# Patient Record
Sex: Female | Born: 1935 | Race: White | Hispanic: No | Smoking: Former smoker
Health system: Southern US, Community
[De-identification: ages and names within clinical notes are randomized; demographics above are authoritative.]

## PROBLEM LIST (undated history)

## (undated) DIAGNOSIS — I499 Cardiac arrhythmia, unspecified: Secondary | ICD-10-CM

## (undated) DIAGNOSIS — G8929 Other chronic pain: Secondary | ICD-10-CM

## (undated) DIAGNOSIS — I2510 Atherosclerotic heart disease of native coronary artery without angina pectoris: Secondary | ICD-10-CM

## (undated) DIAGNOSIS — M549 Dorsalgia, unspecified: Secondary | ICD-10-CM

## (undated) DIAGNOSIS — K219 Gastro-esophageal reflux disease without esophagitis: Secondary | ICD-10-CM

## (undated) DIAGNOSIS — F329 Major depressive disorder, single episode, unspecified: Secondary | ICD-10-CM

## (undated) DIAGNOSIS — R51 Headache: Secondary | ICD-10-CM

## (undated) DIAGNOSIS — F32A Depression, unspecified: Secondary | ICD-10-CM

## (undated) HISTORY — PX: ABDOMINAL HYSTERECTOMY: SHX81

---

## 1998-12-23 ENCOUNTER — Ambulatory Visit (HOSPITAL_COMMUNITY): Admission: RE | Admit: 1998-12-23 | Discharge: 1998-12-23 | Payer: Self-pay | Admitting: Gastroenterology

## 1999-03-03 ENCOUNTER — Encounter (INDEPENDENT_AMBULATORY_CARE_PROVIDER_SITE_OTHER): Payer: Self-pay | Admitting: Specialist

## 1999-03-03 ENCOUNTER — Other Ambulatory Visit: Admission: RE | Admit: 1999-03-03 | Discharge: 1999-03-03 | Payer: Self-pay | Admitting: Gastroenterology

## 1999-03-17 ENCOUNTER — Ambulatory Visit (HOSPITAL_COMMUNITY): Admission: RE | Admit: 1999-03-17 | Discharge: 1999-03-17 | Payer: Self-pay | Admitting: Gastroenterology

## 1999-03-27 ENCOUNTER — Ambulatory Visit (HOSPITAL_COMMUNITY): Admission: RE | Admit: 1999-03-27 | Discharge: 1999-03-27 | Payer: Self-pay | Admitting: Gastroenterology

## 1999-03-27 ENCOUNTER — Encounter: Payer: Self-pay | Admitting: Gastroenterology

## 2002-12-02 ENCOUNTER — Ambulatory Visit (HOSPITAL_COMMUNITY): Admission: RE | Admit: 2002-12-02 | Discharge: 2002-12-02 | Payer: Self-pay | Admitting: Gastroenterology

## 2002-12-02 ENCOUNTER — Encounter: Payer: Self-pay | Admitting: Gastroenterology

## 2002-12-31 ENCOUNTER — Encounter (INDEPENDENT_AMBULATORY_CARE_PROVIDER_SITE_OTHER): Payer: Self-pay | Admitting: Specialist

## 2002-12-31 ENCOUNTER — Ambulatory Visit (HOSPITAL_COMMUNITY): Admission: RE | Admit: 2002-12-31 | Discharge: 2002-12-31 | Payer: Self-pay | Admitting: Gastroenterology

## 2003-01-28 ENCOUNTER — Other Ambulatory Visit: Admission: RE | Admit: 2003-01-28 | Discharge: 2003-01-28 | Payer: Self-pay | Admitting: Dermatology

## 2003-03-24 ENCOUNTER — Other Ambulatory Visit: Admission: RE | Admit: 2003-03-24 | Discharge: 2003-03-24 | Payer: Self-pay | Admitting: Dermatology

## 2003-05-27 ENCOUNTER — Inpatient Hospital Stay (HOSPITAL_COMMUNITY): Admission: EM | Admit: 2003-05-27 | Discharge: 2003-05-31 | Payer: Self-pay | Admitting: Emergency Medicine

## 2003-05-27 ENCOUNTER — Encounter: Payer: Self-pay | Admitting: Emergency Medicine

## 2003-06-20 ENCOUNTER — Emergency Department (HOSPITAL_COMMUNITY): Admission: EM | Admit: 2003-06-20 | Discharge: 2003-06-20 | Payer: Self-pay | Admitting: Emergency Medicine

## 2003-06-20 ENCOUNTER — Encounter: Payer: Self-pay | Admitting: Emergency Medicine

## 2003-07-30 ENCOUNTER — Ambulatory Visit (HOSPITAL_COMMUNITY): Admission: RE | Admit: 2003-07-30 | Discharge: 2003-07-30 | Payer: Self-pay | Admitting: Pulmonary Disease

## 2003-09-09 ENCOUNTER — Emergency Department (HOSPITAL_COMMUNITY): Admission: EM | Admit: 2003-09-09 | Discharge: 2003-09-09 | Payer: Self-pay | Admitting: Emergency Medicine

## 2004-03-14 ENCOUNTER — Ambulatory Visit (HOSPITAL_COMMUNITY): Admission: RE | Admit: 2004-03-14 | Discharge: 2004-03-14 | Payer: Self-pay | Admitting: Gastroenterology

## 2004-03-16 ENCOUNTER — Ambulatory Visit (HOSPITAL_COMMUNITY): Admission: RE | Admit: 2004-03-16 | Discharge: 2004-03-16 | Payer: Self-pay | Admitting: Gastroenterology

## 2004-07-31 ENCOUNTER — Encounter (HOSPITAL_COMMUNITY): Admission: RE | Admit: 2004-07-31 | Discharge: 2004-08-30 | Payer: Self-pay | Admitting: Orthopedic Surgery

## 2004-08-06 ENCOUNTER — Inpatient Hospital Stay (HOSPITAL_COMMUNITY): Admission: EM | Admit: 2004-08-06 | Discharge: 2004-08-08 | Payer: Self-pay | Admitting: Emergency Medicine

## 2004-08-09 ENCOUNTER — Ambulatory Visit: Payer: Self-pay | Admitting: Cardiology

## 2004-08-09 ENCOUNTER — Observation Stay (HOSPITAL_COMMUNITY): Admission: EM | Admit: 2004-08-09 | Discharge: 2004-08-10 | Payer: Self-pay | Admitting: Emergency Medicine

## 2004-09-20 ENCOUNTER — Ambulatory Visit: Payer: Self-pay | Admitting: Gastroenterology

## 2004-12-15 ENCOUNTER — Ambulatory Visit: Payer: Self-pay | Admitting: *Deleted

## 2004-12-16 ENCOUNTER — Emergency Department (HOSPITAL_COMMUNITY): Admission: EM | Admit: 2004-12-16 | Discharge: 2004-12-16 | Payer: Self-pay | Admitting: Emergency Medicine

## 2004-12-28 ENCOUNTER — Emergency Department (HOSPITAL_COMMUNITY): Admission: EM | Admit: 2004-12-28 | Discharge: 2004-12-28 | Payer: Self-pay | Admitting: Emergency Medicine

## 2004-12-28 ENCOUNTER — Ambulatory Visit (HOSPITAL_COMMUNITY): Admission: RE | Admit: 2004-12-28 | Discharge: 2004-12-28 | Payer: Self-pay | Admitting: *Deleted

## 2004-12-28 ENCOUNTER — Inpatient Hospital Stay (HOSPITAL_COMMUNITY): Admission: AD | Admit: 2004-12-28 | Discharge: 2004-12-30 | Payer: Self-pay | Admitting: Cardiology

## 2004-12-29 ENCOUNTER — Ambulatory Visit: Payer: Self-pay | Admitting: *Deleted

## 2005-01-08 ENCOUNTER — Ambulatory Visit: Payer: Self-pay | Admitting: Gastroenterology

## 2005-01-15 ENCOUNTER — Ambulatory Visit: Payer: Self-pay | Admitting: *Deleted

## 2005-02-08 ENCOUNTER — Emergency Department (HOSPITAL_COMMUNITY): Admission: EM | Admit: 2005-02-08 | Discharge: 2005-02-08 | Payer: Self-pay | Admitting: Emergency Medicine

## 2005-02-08 ENCOUNTER — Inpatient Hospital Stay (HOSPITAL_COMMUNITY): Admission: AD | Admit: 2005-02-08 | Discharge: 2005-02-10 | Payer: Self-pay | Admitting: Cardiology

## 2005-02-08 ENCOUNTER — Ambulatory Visit: Payer: Self-pay | Admitting: Cardiology

## 2005-02-23 ENCOUNTER — Inpatient Hospital Stay (HOSPITAL_COMMUNITY): Admission: EM | Admit: 2005-02-23 | Discharge: 2005-02-27 | Payer: Self-pay | Admitting: *Deleted

## 2005-02-27 ENCOUNTER — Ambulatory Visit: Payer: Self-pay | Admitting: *Deleted

## 2005-03-28 ENCOUNTER — Emergency Department (HOSPITAL_COMMUNITY): Admission: EM | Admit: 2005-03-28 | Discharge: 2005-03-28 | Payer: Self-pay | Admitting: Emergency Medicine

## 2005-04-01 ENCOUNTER — Inpatient Hospital Stay (HOSPITAL_COMMUNITY): Admission: EM | Admit: 2005-04-01 | Discharge: 2005-04-03 | Payer: Self-pay | Admitting: Emergency Medicine

## 2005-04-04 ENCOUNTER — Emergency Department (HOSPITAL_COMMUNITY): Admission: EM | Admit: 2005-04-04 | Discharge: 2005-04-04 | Payer: Self-pay | Admitting: Emergency Medicine

## 2005-06-20 ENCOUNTER — Emergency Department (HOSPITAL_COMMUNITY): Admission: EM | Admit: 2005-06-20 | Discharge: 2005-06-20 | Payer: Self-pay | Admitting: Emergency Medicine

## 2005-07-12 ENCOUNTER — Other Ambulatory Visit: Admission: RE | Admit: 2005-07-12 | Discharge: 2005-07-12 | Payer: Self-pay | Admitting: Unknown Physician Specialty

## 2006-05-21 ENCOUNTER — Ambulatory Visit: Payer: Self-pay | Admitting: Internal Medicine

## 2006-06-03 ENCOUNTER — Ambulatory Visit (HOSPITAL_COMMUNITY): Admission: RE | Admit: 2006-06-03 | Discharge: 2006-06-03 | Payer: Self-pay | Admitting: Internal Medicine

## 2006-06-03 ENCOUNTER — Encounter (INDEPENDENT_AMBULATORY_CARE_PROVIDER_SITE_OTHER): Payer: Self-pay | Admitting: *Deleted

## 2006-06-03 ENCOUNTER — Ambulatory Visit: Payer: Self-pay | Admitting: Internal Medicine

## 2007-04-29 IMAGING — CT CT HEAD W/O CM
1 series · 15 of 30 positions shown, 19 images · IV contrast (agent unspecified)
Comparison: none

CLINICAL DATA: The patient has a fall.  Now has altered level of consciousness. 
 HEAD CT WITHOUT CONTRAST:
TECHNIQUE: 5 mm collimated images were obtained from the base of the skull through the vertex according to standard protocol without contrast.
 A series of scans of the entire head are made and show some soft tissue swelling over the left forehead area.   There is no evidence of skull fracture or foreign body.   There is slight deviation of the nasal bones from left to right.   The paranasal sinuses and base of the skull appear normal.   There is no evidence of acute intracranial finding.   There is mild symmetrical atrophy.
 Ventricular system is normal.   There is no shift to the midline structures.
TECHNIQUE: Multidetector CT imaging of the cervical spine was performed.  Multiplanar CT image reconstructions were also generated.
 CT scan of the cervical spine is made without contrast, but with coronal and sagittal reconstructions, and shows no evidence of acute fracture.   There are degenerative disc changes most marked at C4-5 and C5-6, but also at C3-4 where there is a small posterior hypertrophic spur.   No definite compromise of the foramina is seen.   The soft tissues appear normal.   There is noted an old healed fracture of the anterior mandible with wire suture in the region of that healed fracture.   There is also noted some mild widening and thickening of the lower cervical esophagus with no evidence of mass identified.

[Series 2089: — · axial · 0.45mm/px · z∈[-612,-472]mm · 15 of 32 slices shown, 19 images]
[im 2/32  brain]
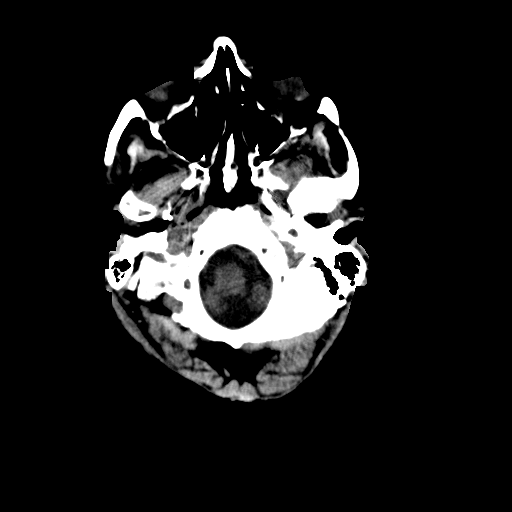
[im 2/32  bone]
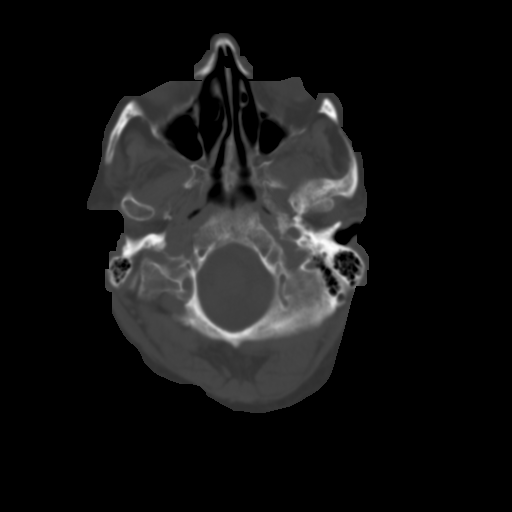
[im 4/32  brain]
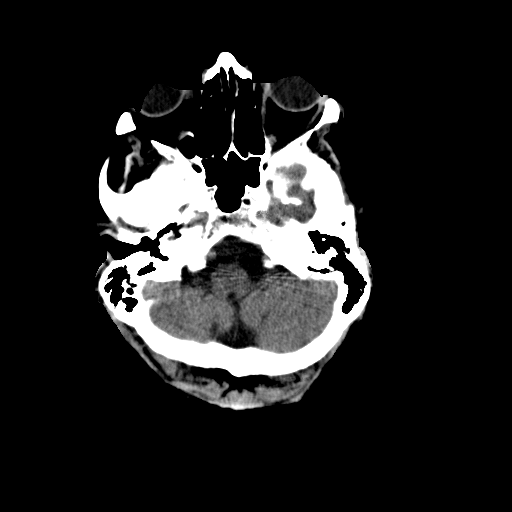
[im 6/32  brain]
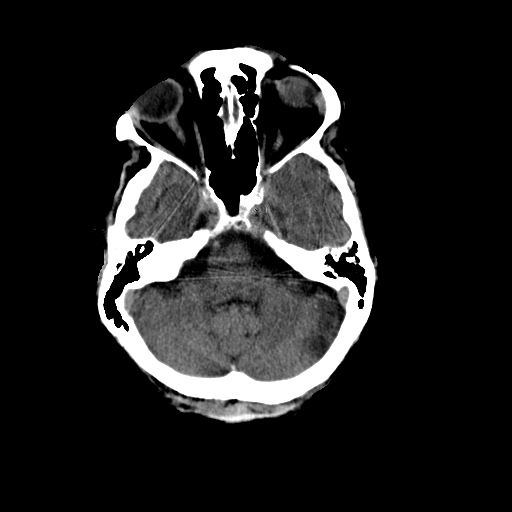
[im 8/32  brain]
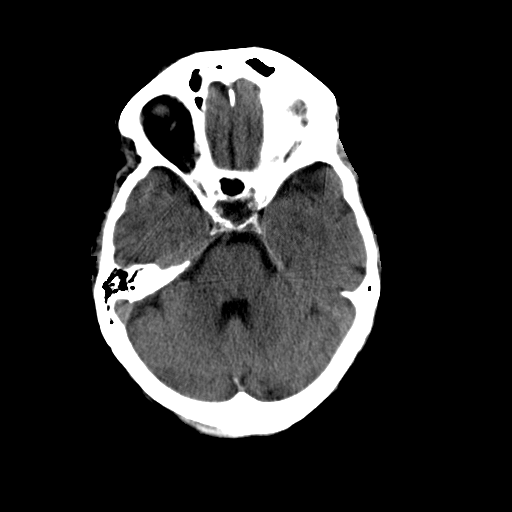
[im 10/32  brain]
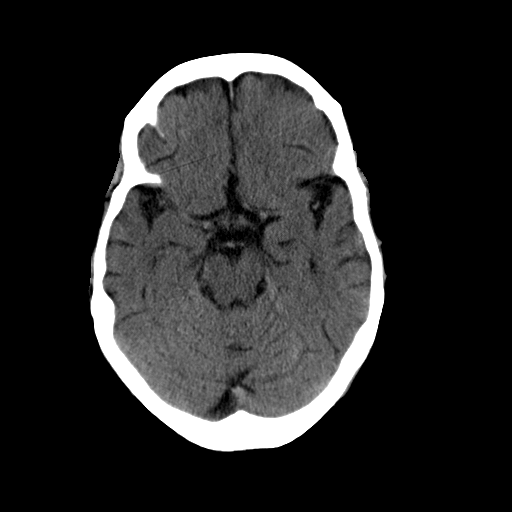
[im 10/32  bone]
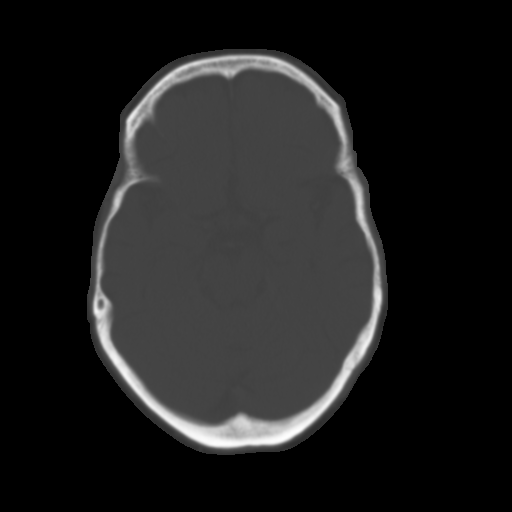
[im 12/32  brain]
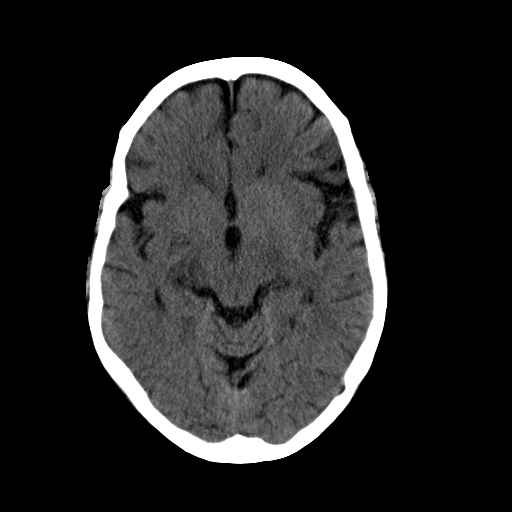
[im 14/32  brain]
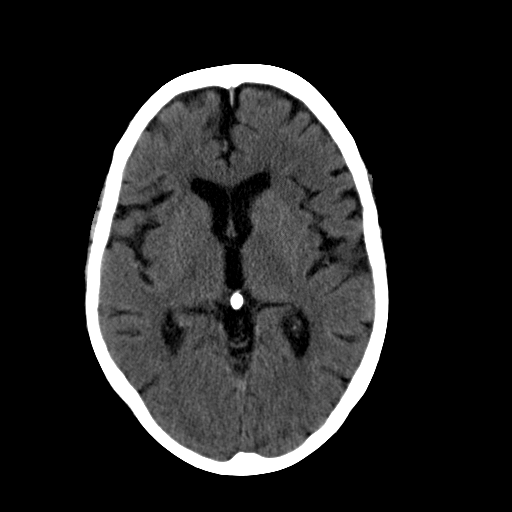
[im 17/32  brain]
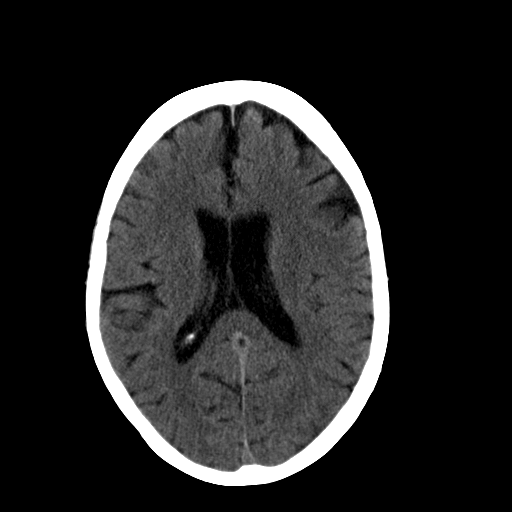
[im 18/32  brain]
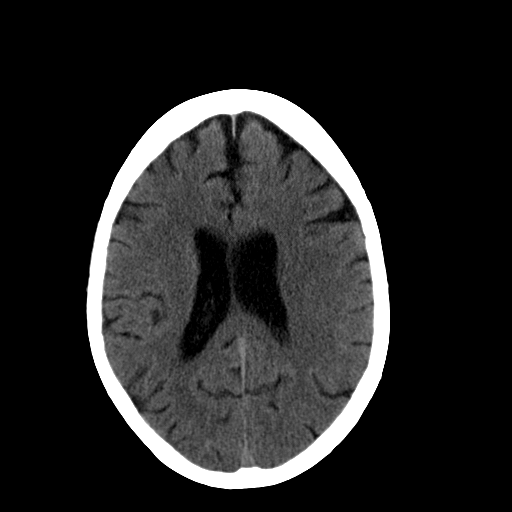
[im 18/32  bone]
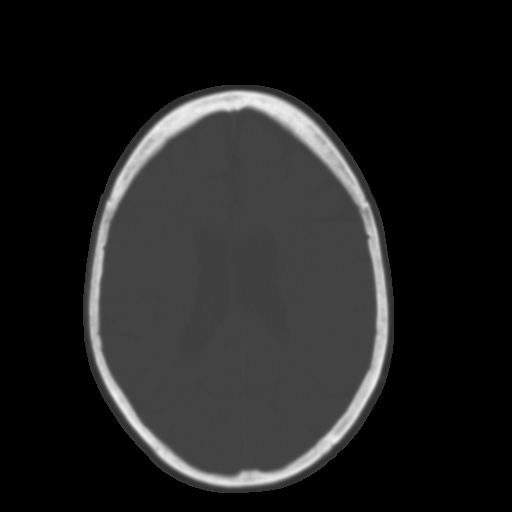
[im 20/32  brain]
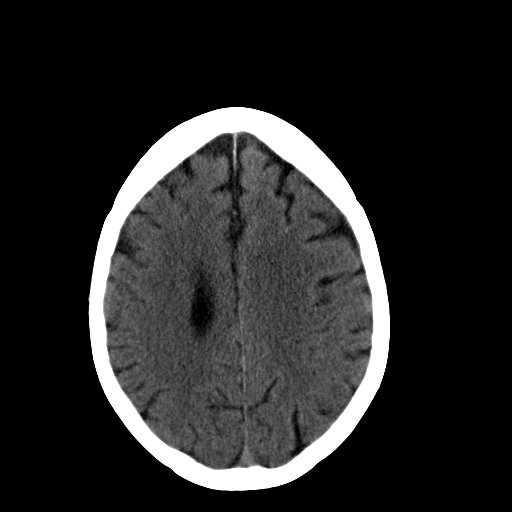
[im 22/32  brain]
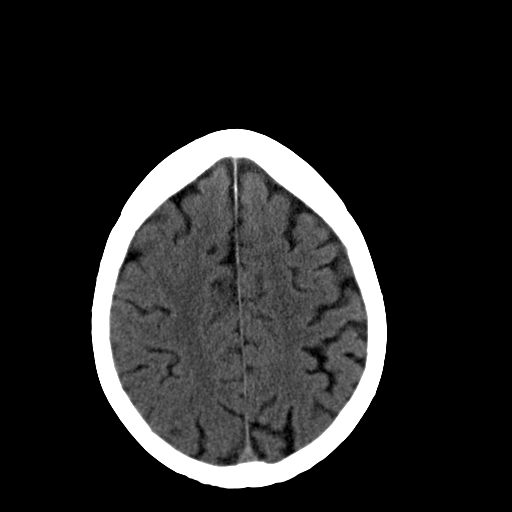
[im 24/32  brain]
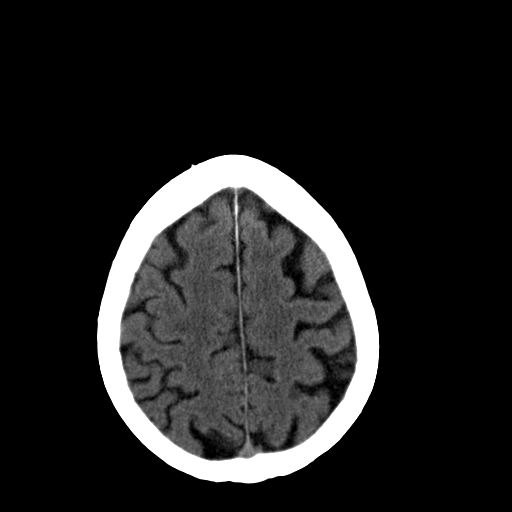
[im 26/32  brain]
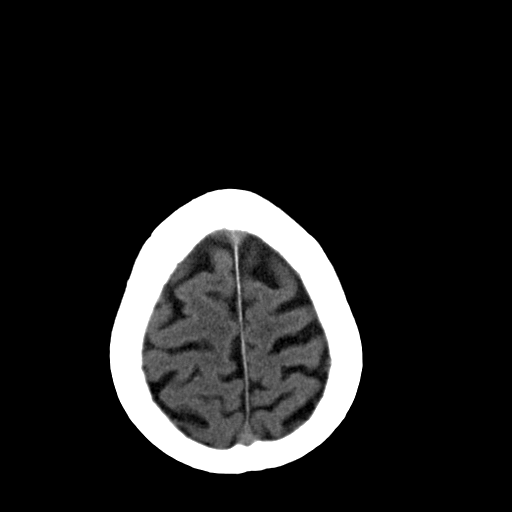
[im 26/32  bone]
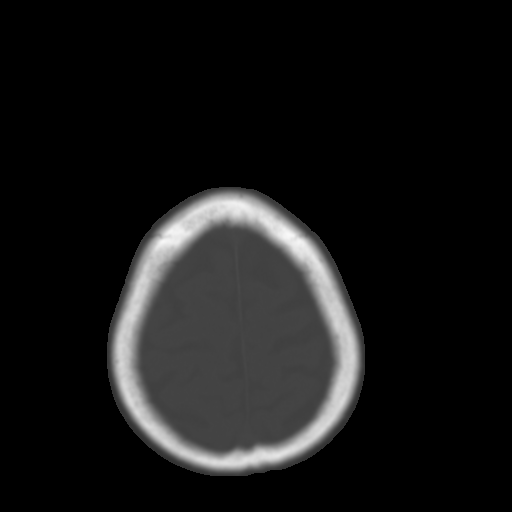
[im 28/32  brain]
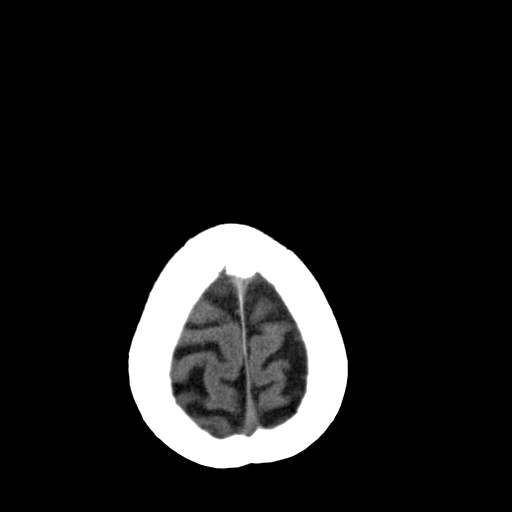
[im 30/32  brain]
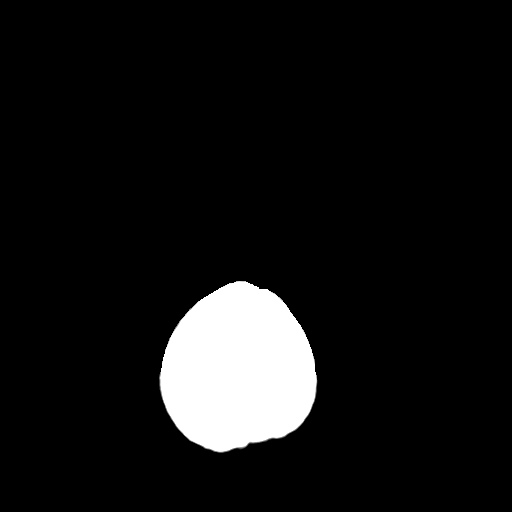

[15 of 30 positions shown; findings below may reference images not displayed]

IMPRESSION: No evidence of skull fracture or foreign body.   No evidence of acute intracranial finding.   There is mild atrophy.   There is some soft tissue swelling in left forehead area.
 CERVICAL SPINE CT WITHOUT CONTRAST:
IMPRESSION: 1.  No fracture or dislocation cervical spine.   Degenerative changes mid and lower cervical spine as noted above. 
 2.  Old healed fracture of the anterior mandible.  Some thickening prominence of the lower cervical and upper thoracic esophagus.

## 2010-08-10 ENCOUNTER — Ambulatory Visit: Payer: Self-pay | Admitting: Internal Medicine

## 2010-10-01 ENCOUNTER — Encounter: Payer: Self-pay | Admitting: *Deleted

## 2010-10-01 ENCOUNTER — Encounter: Payer: Self-pay | Admitting: Gastroenterology

## 2011-01-26 NOTE — Group Therapy Note (Signed)
NAME:  Monica Garner            ACCOUNT NO.:  [id number]   MEDICAL RECORD NO.:  [id number]          PATIENT TYPE:  INP   LOCATION:  A332                          FACILITY:  [facility]   PHYSICIAN:  Edward L. Juanetta Gosling, M.D.DATE OF BIRTH:  11-27-1935   DATE OF PROCEDURE:  DATE OF DISCHARGE:                                   PROGRESS NOTE   PROBLEM:  Diabetes and hypoglycemia.   SUBJECTIVE:  Monica Garner was admitted with low blood sugar.  She has  unfortunately missed a number of appointments in my office.  She has been  trying to manage her blood sugar on her own, and her sugar has been quite  low and high.  She and I have discussed this at length today, and she wants  to go ahead and try to get on to some sort of a stable regimen which I think  is appropriate, but she is going to have to follow up.   OBJECTIVE:  Her exam today shows that her temperature is 97.2, pulse is 84,  respirations 18, blood sugar 260, blood pressure 147/70.  Heart is regular.  Abdomen is soft.  Extremities showed no edema.   ASSESSMENT/PLAN:  She has multiple problems.  We will try to get her better  regulated as far as her sugars are concerned and go ahead with diabetic  teaching, etc.     Edwa   ELH/MEDQ  D:  08/07/2004  T:  08/07/2004  Job:  [id number]

## 2011-01-26 NOTE — Group Therapy Note (Signed)
NAME:  Monica Garner, Monica Garner            ACCOUNT NO.:  [id number]   MEDICAL RECORD NO.:  [id number]          PATIENT TYPE:  INP   LOCATION:  A222                          FACILITY:  [facility]   PHYSICIAN:  Edward L. Juanetta Gosling, M.D.DATE OF BIRTH:  Jun 21, 1936   DATE OF PROCEDURE:  DATE OF DISCHARGE:  02/27/2005                                   PROGRESS NOTE   PROGRESS NOTE:  Mrs. Tates says she is better.  She wants to go home  today.  She has no new complaints.  She is still a little weak but she has  not had any chest pain.  She is able to get up.  Her urine is better.  She  feels better.   PHYSICAL EXAMINATION:  VITAL SIGNS:  Her blood pressure is in the 120's.  Pulse is in the 60's.  CHEST:  Is clear.  HEART:  Is regular.   Dr. Anthonette Legato has seen her and feels that it is okay for her to be discharged.   ASSESSMENT:  She is better.   PLAN:  Discharge home.       ELH/MEDQ  D:  02/27/2005  T:  02/27/2005  Job:  [id number]

## 2011-01-26 NOTE — Discharge Summary (Signed)
NAME:  Monica Garner, Monica Garner            ACCOUNT NO.:  [id number]   MEDICAL RECORD NO.:  [id number]          PATIENT TYPE:  INP   LOCATION:  A332                          FACILITY:  [facility]   PHYSICIAN:  Edward L. Juanetta Gosling, M.D.DATE OF BIRTH:  [birth year]   DATE OF ADMISSION:  08/06/2004  DATE OF DISCHARGE:  11/29/2005LH                                 DISCHARGE SUMMARY   FINAL DISCHARGE DIAGNOSES:  1.  Hypoglycemia.  2.  Diabetes mellitus.  3.  Status post stroke.  4.  Gastroesophageal reflux disease.  5.  Diabetic gastroparesis.  6.  Diabetic neuropathy.  7.  Chronic pain syndrome.  8.  Anxiety and depression.  9.  Coronary artery occlusive disease.  10. Hypokalemia.   HISTORY:  Ms. Inscore is a 75 year old who has a history of poorly  controlled diabetes mellitus.  She has had multiple hospital stays at  [facility] mostly for hypoglycemia and has had multiple appointments in my  office that she failed to keep because of transportation or other issues.  She took her regular insulin at home and found out that her blood sugar was  greater than 500.  Then took extra Glucovance, then she took even more  Glucovance, and noticed that her blood sugar dropped.  She called for help  and was brought to the emergency room.  Accu-Chek initially was 40, then  went up to 130, then back to 60.  She was placed on D5W and had increased  blood sugar then.   Her medications are quite confused, but apparently are Nexium 40 mg b.i.d.,  Plavix 75 mg daily, Reglan 10 mg q.a.c. and q.h.s., Neurontin 300 mg at  bedtime, Glucovance 5/500 b.i.d., Humulin 70/30 35 units in the morning and  2 units at night, OxyContin 40 mg t.i.d. p.r.n. pain, Xanax 1 mg b.i.d. and  2 mg at bedtime, Celexa 40 mg daily.  She has been on Lipitor 20 mg daily.  She has been on lisinopril, but apparently that caused a cough.   PHYSICAL EXAMINATION:  VITAL SIGNS:  Blood pressure 128/54, pulse 82, O2 sat  98%.  GENERAL:  No acute  distress.  LUNGS:  Clear.  HEART:  Regular.  ABDOMEN:  Soft without masses.  EXTREMITIES:  No edema.   HOSPITAL COURSE:  She was started on a diabetic teaching and after  discussion, she agrees that she will follow up in my office and bring all of  her medications to my office and agrees to home health help.  She improved  over the next several days.   She is discharged home to continue on her Humulin 70/30 35 units in the  morning, 10 units at night.  She is to take her Glucovance b.i.d. as before.  She is to continue with her other medications.  She is to check her blood  sugars  q.a.c. and q.h.s.  She is to be on a sliding scale of Humulin R:  Greater  than 450, 15 units; 400-450, 12 units; 350-399, 10 units; 300-349, 8 units;  250-299, 6 units; 200-249, 4 units; less than 200, 0.  She is to  follow up  in my office in about a week.     Edwa   ELH/MEDQ  D:  08/08/2004  T:  08/08/2004  Job:  [id number]

## 2011-01-26 NOTE — H&P (Signed)
NAME:  Monica, Garner            ACCOUNT NO.:  [id number]   MEDICAL RECORD NO.:  [id number]          PATIENT TYPE:  EMS   LOCATION:  ED                            FACILITY:  [facility]   PHYSICIAN:  Edward L. Juanetta Gosling, M.D.DATE OF BIRTH:  1936/08/10   DATE OF ADMISSION:  02/23/2005  DATE OF DISCHARGE:  LH                                HISTORY & PHYSICAL   REASON FOR ADMISSION:  Change in mental status.   HISTORY:  Monica Garner is a 75 year old who was getting out of bed on the  morning of admission and fell. It is not quite clear exactly what happened,  but she eventually came to the emergency room where she had CT scanning done  which showed old changes but has been confused and difficult to arouse. She  does have a contusion on the side of her face, so it is possible that she  has had concussion. She has a very complicated past medical history  including COPD, GI bleeding, type 2 diabetes, obesity, CVA, renal  insufficiency, right bundle branch block, left anterior fascicular block,  and coronary artery occlusive disease. She has had a recent stent to the LAD  in April 2006. She is arousable in the emergency room but not back to  normal. Of interest is the fact that earlier about a week ago she had called  my office stating that she needed a more powerful nerve medication which I  did not give her. My concern was that she might have over used her  medications, and that is my concern about what may have happened here is  that she may have over used her medication. There have been problems with  her medications, and it has been difficult for her to be sure exactly what  she is taking.   MEDICATIONS:  1.  Nitroglycerin 0.4 p.r.n.  2.  Lopressor 25 mg b.i.d.  3.  Glucovance 2.5/500.  4.  70/30 insulin.  5.  Neurontin 300 mg t.i.d.  6.  Prilosec 40 mg daily.  7.  Vytorin 10/20 daily.  8.  Demadex 100 mg daily.  9.  Plavix 75 mg daily.  10. Potassium chloride 10 mEq daily.  11.  Celexa 40 mg daily.  12. Zelnorm 6 mg b.i.d.  13. Zyrtec D as needed.   SOCIAL HISTORY:  She lives in [place] alone. She smokes about two packs of  cigarettes daily.   FAMILY HISTORY:  Mother died in her 76s of CHF. Father also died in his 4s  of CHF.   REVIEW OF SYSTEMS:  Really unobtainable at this time.   PHYSICAL EXAMINATION:  GENERAL:  Shows that she will arouse but then goes  right back to sleep.  VITAL SIGNS:  Blood pressure 116/57, heart rate 81, respirations 18,  temperature 99, O2 saturation is 94%.  HEENT:  Her pupils are reactive. Nose and throat are clear.  NECK:  Supple. Her mucous membranes are dry.  CHEST:  Her chest is fairly clear without wheezes.  HEART:  Regular without gallop.  ABDOMEN:  Her abdomen is soft. No masses are felt.  EXTREMITIES:  Showed no edema.  CENTRAL NERVOUS SYSTEM:  As above. No focal findings. Just generally  sluggish and difficult to arouse.   STUDIES:  CT scan did not show any acute changes, specifically no stroke.  Her lab work:  Myoglobin 194, CK-MB listed at 80. CBC showed white count  16,300, hemoglobin 13. Comprehensive metabolic profile:  Glucose was 113,  BUN 42, alkaline phosphatase 127, SGOT of 92. Urinalysis showed blood, few  bacteria.       ELH/MEDQ  D:  02/23/2005  T:  02/23/2005  Job:  [id number]

## 2011-01-26 NOTE — Group Therapy Note (Signed)
   NAME:  CARLISA, EBLE                      ACCOUNT NO.:  [id number]   MEDICAL RECORD NO.:  [id number]                   PATIENT TYPE:  INP   LOCATION:  A218                                 FACILITY:  [facility]   PHYSICIAN:  Mila Homer. Sudie Bailey, M.D.           DATE OF BIRTH:  1935/09/29   DATE OF PROCEDURE:  05/30/2003  DATE OF DISCHARGE:                                   PROGRESS NOTE   SUBJECTIVE:  Generally the patient is feeling well today.  She has no  specific complaints.   OBJECTIVE:  She is again sitting up in a chair.  She is oriented and alert,  no acute distress.  There is no slurring of her speech.  Sentence structure  was intact. Temperature 97.6, pulse 80, respiratory rate 16, blood pressure  145/79.  Lungs are clear throughout.  She is breathing well.  The heart has  a regular rhythm, rate of 70.  There is no edema of the ankles.  Today in  the last 24 hours here glucoses were 208, 281, 239.   ASSESSMENT:  1. This 75 year old is status post a transient ischemic attack.  2. Insulin-dependent diabetes.  3. Essential hypertension.  4. Severe arthritis.   PLAN:  Continue with her pantoprazole 40 mg p.o. daily, amlodipine 5 mg  daily, Catapres-2 patch weekly, Plavix 75 mg daily, sliding scale Novolog  insulin.  Continue monitoring her sugars.                                               Mila Homer. Sudie Bailey, M.D.    SDK/MEDQ  D:  05/30/2003  T:  05/31/2003  Job:  [id number]

## 2011-01-26 NOTE — Group Therapy Note (Signed)
   NAME:  Monica Garner, Monica Garner                      ACCOUNT NO.:  [id number]   MEDICAL RECORD NO.:  [id number]                   PATIENT TYPE:  INP   LOCATION:  A218                                 FACILITY:  [facility]   PHYSICIAN:  Edward L. Juanetta Gosling, M.D.             DATE OF BIRTH:  11/09/1935   DATE OF PROCEDURE:  05/31/2003  DATE OF DISCHARGE:                                   PROGRESS NOTE   PROBLEM LIST:  1. Status post transient ischemic attack.  2. Chronic obstructive pulmonary disease.  3. Hypertension.  4. Arthritis.   SUBJECTIVE:  Ms. Mccomber says she is feeling pretty well.  She has no new  complaints.   OBJECTIVE:  Her exam shows her temperature is 97, pulse 74, respirations 20,  blood sugar between 209 and 281, blood pressure 132/69.  Her chest is clear  without wheezes, her heart is regular without gallop, I&O -1250, she does  not have any focal neurological findings.   ASSESSMENT:  Overall things seem to be going pretty well.   PLAN:  PT and OT evaluations are already done and thus far things seem to be  going well.  She may be able to be discharged later today.                                               Edward L. Juanetta Gosling, M.D.    ELH/MEDQ  D:  05/31/2003  T:  05/31/2003  Job:  [id number]

## 2011-01-26 NOTE — Discharge Summary (Signed)
NAME:  Monica Garner, Monica Garner            ACCOUNT NO.:  [id number]   MEDICAL RECORD NO.:  [id number]          PATIENT TYPE:  INP   LOCATION:  A217                          FACILITY:  [facility]   PHYSICIAN:  Edward L. Juanetta Gosling, M.D.DATE OF BIRTH:  [birth year]   DATE OF ADMISSION:  04/01/2005  DATE OF DISCHARGE:  07/25/2006LH                                 DISCHARGE SUMMARY   FINAL DISCHARGE DIAGNOSES:  1.  Change in mental status probably due to inadvertent overuse of      medications.  2.  Diabetes.  3.  Hypertension.  4.  Coronary artery occlusive disease.  5.  Irritable bowel syndrome.  6.  Anxiety and depression.  7.  Gastroesophageal reflux disease.  8.  Chronic low back pain.   HISTORY OF PRESENT ILLNESS:  Mrs.  Garner is a 75 year old who was found  unresponsive at home.  She had a previous episode similar to this when she  had clearly taken too much of her medication.  She had called my office in  the week prior to admission asking for an increased dose in pain medication,  and I told her that we did not want to do that, because that seemed to have  caused her to have problems the last time she was admitted.  Her medication  list is confused, and the list of medications listed by Dr. Delbert Harness is not  correct.   PHYSICAL EXAMINATION:  VITAL SIGNS:  Blood pressure 154/98, pulse 69,  respirations 28.  CHEST:  Prolonged expiratory phase.  HEART:  Regular with a 1/6 systolic murmur.  ABDOMEN:  Soft.  EXTREMITIES:  No edema.  CNS:  No focal neurological findings.   HOSPITAL COURSE:  She was observed.  Came back to her normal mental status  and admitted that she had taken too much of her medication again.  She is  requesting admission to an assisted living center which should take the  worry about taking too much medication away.  This is being arranged.  She  said she has several things that she needs to do first.  She had evaluation  by the ACT Team who felt she was self  medicating her known severe anxiety  and depression with her p.r.n. medications including pain medications and  requested that she see someone in follow up which makes excellent sense.  By  the time of discharge, she is back to normal.  She is discharged home on the  following medications.   DISCHARGE MEDICATIONS:  1.  Xanax 1 mg t.i.d. p.r.n.  2.  Glucovance 2.5/500 b.i.d.  3.  Plavix 75 mg daily.  4.  Zelnorm 6 mg b.i.d.  5.  Celexa 30 mg daily.  6.  Prilosec 20 mg daily.  7.  Demadex 50 mg daily.  8.  Nitroglycerin p.r.n.  9.  Lopressor 25 mg b.i.d.  10. Neurontin 300 mg t.i.d.  11. Hydrocodone/APAP 7.5/500 q.i.d. p.r.n.   FOLLOWUP:  She is to be followed by [facility] and follow up in my  office.       ELH/MEDQ  D:  04/03/2005  T:  04/03/2005  Job:  [id number]

## 2011-01-26 NOTE — Consult Note (Signed)
NAME:  Monica Garner, Monica Garner                      ACCOUNT NO.:  [id number]   MEDICAL RECORD NO.:  [id number]                   PATIENT TYPE:  INP   LOCATION:  IC04                                 FACILITY:  [facility]   PHYSICIAN:  Kofi A. Gerilyn Pilgrim, M.D.              DATE OF BIRTH:  [birth year]   DATE OF CONSULTATION:  05/27/2003  DATE OF DISCHARGE:                                   CONSULTATION   REFERRING PHYSICIAN:  Dr. Ramon Dredge L. Hawkins.   IMPRESSION:  Left hemispheric acute infarct.  The likely localization is in  the left temporoparietal region.   RECOMMENDATIONS:  Continue with the Plavix.  She has had some problems  swallowing and with vomiting so NG tube may be needed, but I think she could  get her antiplatelet agents.  Swallowing evaluation and speech therapy have  already been ordered and I agree with this.  I would discontinue the  steroid, as this has not been shown to improve outcomes and, in fact, may  cause worsening outcomes, as it increases hyperglycemia, which is known to  increase neurological deterioration/insult.  Low-dose heparin may be fine  for DVT prophylaxis.  Other suggestions include an MRI of the brain,  electroencephalography, echocardiography and carotid Doppler.   HISTORY:  This is a 75 year old Caucasian lady who apparently activated the  medical emergency system.  At home, she was found to be alert and talking  but was not feeling well.  On arrival to the emergency room, she was noted  to have significant speaking impairment.  Imaging of the brain showed no  acute process and she was admitted to ICU for diagnosis of stroke and for  further evaluation.  She was noted to have significant emesis, about seven  times.  Further history cannot be obtained as the patient has significant  speaking impairment.   PAST MEDICAL HISTORY:  The past medical history includes hypertension,  diabetes and myocardial infarction.   ADMISSION MEDICATIONS:  Celexa, Flexeril,  Xanax, OxyContin, Prevacid,  Sonata, nitroglycerin, Norvasc, insulin.   ALLERGIES:  Aspirin.   SOCIAL HISTORY:  Smokes tobacco.   REVIEW OF SYSTEMS:  Review of systems are limited, given this marked speech  impairment, other than stated in history of present illness.   PHYSICAL EXAMINATION:  VITAL SIGNS:  Blood pressure 136/75, pulse 114,  respirations 18, temperature 98.6.  Saturation 97% on room air.  NECK:  Neck is supple.  CARDIOVASCULAR:  Exam reveals normal S1 and S2 but increased heart rate.  LUNGS:  Lungs are clear.  ABDOMEN:  Abdomen is obese but soft.  EXTREMITIES:  No edema is noted.  NEUROLOGIC:  She is awake and alert.  She has marked comprehension  impairment and cannot comprehend or follow simple commands.  She does  respond to her name, however.  She does not follow -- not even midline --  commands.  She has fluent but incomprehensible and nonsensical speech.  Most  of it is actually incomprehensible.  Interestingly, there is a rhyming  pattern to this talking.  Examples include: I don't expect the plafonic  with the safronic.  Additional examples are:  I can't plonic if I cannot  tonic.  Are you going to plonic on the clonic?  Additional examples  include:  Clononic, phonic, ronic on a cabonic, safronic, clonic diabetic.  The patient had frequent speech output again, but no comprehension.  Cranial  nerve evaluation:  Pupils were 5 mm and briskly reactive.  She seemed to  have a somewhat-to-the-left-gaze preference.  Extraocular movements are  full.  Facial muscle strength appears to be symmetric.  Once again, because  of impaired comprehension, the examination was limited.  Tongue appears to  be in the midline.  She had antigravity strength in all four extremities.  No clear pronator drift was seen.  Reflexes are +2.  She has an upgoing toe  on the right.   IMAGING STUDIES:  Imaging of the brain shows no acute process.   ACCESSORY CLINICAL DATA:  EKG shows sinus  tachycardia with bifascicular  block, right bundle branch block and left anterior fascicular block.  There  is also evidence of possible left atrial enlargement.   Thank you for this consultation.                                               Kofi A. Gerilyn Pilgrim, M.D.    KAD/MEDQ  D:  05/27/2003  T:  05/27/2003  Job:  [id number]

## 2011-01-26 NOTE — Group Therapy Note (Signed)
NAME:  ENDIYA, KLAHR            ACCOUNT NO.:  [id number]   MEDICAL RECORD NO.:  [id number]          PATIENT TYPE:  INP   LOCATION:  A332                          FACILITY:  [facility]   PHYSICIAN:  Edward L. Juanetta Gosling, M.D.DATE OF BIRTH:  [birth year]   DATE OF PROCEDURE:  DATE OF DISCHARGE:  08/08/2004                                   PROGRESS NOTE   PROBLEM:  Hypoglycemia.   SUBJECTIVE:  Ms. Spiller says she is much better.  She has no new  complaints.  Her blood sugars through the night have ranged from 231-453 so  clearly she is no longer hypoglycemia.  She has not had any insulin and she  has been on dextrose.  I have told her we need to go ahead and stop all that  and probably let her go home.  I have reminded her that she needs to take  her insulin, but only if she is going to eat.  Will get home health  involved.  Please see discharge summary for details.     Edwa   ELH/MEDQ  D:  08/10/2004  T:  08/10/2004  Job:  [id number]

## 2011-01-26 NOTE — H&P (Signed)
NAME:  Monica Garner, Monica Garner            ACCOUNT NO.:  [id number]   MEDICAL RECORD NO.:  [id number]          PATIENT TYPE:  INP   LOCATION:  A335                          FACILITY:  [facility]   PHYSICIAN:  Edward L. Juanetta Gosling, M.D.DATE OF BIRTH:  06/04/1936   DATE OF ADMISSION:  08/09/2004  DATE OF DISCHARGE:  LH                                HISTORY & PHYSICAL   REASON FOR ADMISSION:  Hypoglycemia.   HISTORY:  Monica Garner is a 75 year old who was in her usual state of  fairly poor health at home when she developed increasing problems with  feeling dizzy.  She had gone to see her cardiologist, and it turns out that  she had taken her blood sugar, then took sliding scale Humulin R as she had  been instructed but did not eat because she had a doctor's appointment, and  then after she got to the doctor's office she became weak and dizzy and had  what appeared to be hypoglycemia.  She was brought to the emergency room  where she was evaluated, given D-50.  Her blood sugar came up, then it  dropped again, and then it came back up, and dropped again.  So, she is  being brought in for overnight observation to make sure that we get her  sugar in better control.  She has just been started on the sliding scale  Humulin R.  She says she has not taken any of her previous Glucovance.  Her  blood sugar had actually been a bit better.  She was just discharged from the hospital, on August 07, 2004, for a  similar problem.   MEDICATIONS:  As best I can tell are:  1.  Nexium 40 mg b.i.d.  2.  Plavix 75 mg daily.  3.  Reglan 10 mg a.c. and h.s.  4.  Neurontin 300 mg at bedtime.  5.  Humulin 70/30, 35 units in the morning, 10 units in the evening, and the      sliding scale.  She has a large number of other medications, I am  not sure if she actually  takes them or not.   SOCIAL HISTORY:  She lives at home alone.  We have started her on home  health services.   REVIEW OF SYSTEMS:  Positive for chronic  pain.   PAST MEDICAL HISTORY:  1.  Positive for diabetes, poorly controlled.  2.  Previous stroke with expressive aphasia.  She said she had another      stroke treated at [facility] without, I do not know all the      information about that.  3.  Cardiac disease.   REVIEW OF SYSTEMS:  Except as mentioned is negative.   PHYSICAL EXAMINATION:  GENERAL:  Shows that she is now awake and alert with  no complaints.  VITAL SIGNS:  Pulse is 76, respirations 18, temp 96.5, blood pressure  100/59, O2 sat is 91%.  NECK:  Supple without masses.  HEENT:  Her mucous membranes are dry.  Her nose and throat are clear.  CHEST:  Clear.  HEART:  Regular.  ABDOMEN:  Soft.  EXTREMITIES:  Showed no edema.   ASSESSMENT:  I think she has hypoglycemia probably from taking her sliding  scale insulin and not eating.   PLAN:  Watch her overnight and give her D-5-W as needed.  Institute the  hypoglycemia protocol and then plan for probable discharge tomorrow  depending on how she does.     Edwa   ELH/MEDQ  D:  08/09/2004  T:  08/09/2004  Job:  [id number]

## 2011-01-26 NOTE — Op Note (Signed)
NAME:  Monica Garner, Monica Garner            ACCOUNT NO.:  [id number]   MEDICAL RECORD NO.:  [id number]          PATIENT TYPE:  AMB   LOCATION:  DAY                           FACILITY:  [facility]   PHYSICIAN:  Lionel December, M.D.    DATE OF BIRTH:  1936-08-27   DATE OF PROCEDURE:  06/03/2006  DATE OF DISCHARGE:                                 OPERATIVE REPORT   PROCEDURE:  Esophagogastroduodenoscopy with esophageal dilation followed by  colonoscopy.   INDICATIONS:  Jeweline is a 75 year old Caucasian female with multiple medical  problems, who also has chronic GERD and maintained on double dose PPI, who  now presents with dysphagia primarily to solids.  Her last EGD was about 6  years ago. and she did well until about a year ago.  She also has history of  colonic polyps and is undergoing surveillance colonoscopy as well.  Procedures and risks were reviewed with the patient, and informed consent  was obtained.   MEDICATIONS FOR CONSCIOUS SEDATION:  1. Benzocaine spray for pharyngeal topical anesthesia.  2. Demerol 50 mg IV.  3. Versed 17 mg IV in divided dose.   FINDINGS:  Procedure performed in endoscopy suite.  The patient's vital  signs and O2 saturation were monitored during the procedure and remained  stable.   PROCEDURE:  1. Esophagogastroduodenoscopy.  The patient was placed in the left lateral      recumbent position.  The Olympus videoscope was passed via the      oropharynx without any difficulty into the esophagus.   Esophagus: Mucosa of the esophagus was normal.  The GE junction was at 36 cm  from the incisors, with a noncritical  inconspicuous ring and a small  sliding hiatal hernia.  The GE junction was at 38 cm from the incisors.   The stomach was empty and distended very well with insufflation.  Folds of  the proximal stomach were normal.  Examination of the mucosa at body,  antrum, pyloric channel, as well as angularis, fundus and cardia was normal.   Duodenum:  Bulbar  mucosa was normal.  Scope was passed to the second part of  the duodenum, where mucosa and folds were normal.  Endoscope was withdrawn.   Esophagus was dilated by passing a 56 [place] Maloney dilator to full  insertion.  As the dilator was withdrawn, endoscope was passed again, and  there was mucosal disruption noted at the GE junction.  Pictures were taken  for the record.  Endoscope was withdrawn.  Patient prepared for procedure  #2.   1. Colonoscopy.  Rectal examination performed.  No abnormality noted on      external or digital exam.  Olympus videoscope was placed in the rectum      and advanced under vision into the sigmoid colon and beyond.      Preparation was satisfactory, but she still had pieces of formed stool      and liquid stool scattered here and there.  There were a few small      diverticula at the sigmoid colon.  Scope was passed into the cecum,  which was identified by ileocecal valve.  The blunt cecum was not well      seen.  Pictures taken for the record.  As the scope was withdrawn,      colonic mucosa was carefully examined.  There was a 4-5 mm polyp at the      proximal transverse colon which was ablated via cold biopsy.  Mucosa of      the rest of the colon was normal.  Rectal mucosa similarly was normal.      Scope was retroflexed to examine anorectal junction, and small      hemorrhoids were noted below the dentate line.  Endoscope was then      withdrawn.  The patient tolerated the procedure well.   FINAL DIAGNOSES:  1. Distal esophageal ring and a small sliding hiatal hernia otherwise      normal esophagogastroduodenoscopy.  2. Esophagus dilated by passing 56 French Maloney dilator, resulting in      disruption of this ring.  3. A 4-5 mm polyp ablated via cold biopsy from proximal transverse colon.  4. Few diverticula at sigmoid colon.  5. External hemorrhoids.   RECOMMENDATIONS:  __________  instructions given.  She will resume her  Plavix.   I  will be contacting the patient with the results of the biopsy.      Lionel December, M.D.  Electronically Signed     NR/MEDQ  D:  06/03/2006  T:  06/05/2006  Job:  [id number]   cc:   Kirstie Peri, MD  Fax: [phone]

## 2011-01-26 NOTE — Procedures (Signed)
NAME:  Monica Garner, Monica Garner            ACCOUNT NO.:  [id number]   MEDICAL RECORD NO.:  [id number]          PATIENT TYPE:  INP   LOCATION:  A222                          FACILITY:  [facility]   PHYSICIAN:  Edward L. Juanetta Gosling, M.D.DATE OF BIRTH:  02-24-1936   DATE OF PROCEDURE:  02/24/2005  DATE OF DISCHARGE:                                EKG INTERPRETATION   Time 8:08 February 24, 2005. The rhythm is sinus rhythm. PR interval does  measure somewhat short. There is right bundle branch block. There is also  some left anterior hemiblock. Abnormal electrocardiogram.       ELH/MEDQ  D:  02/25/2005  T:  02/26/2005  Job:  [id number]

## 2011-01-26 NOTE — Cardiovascular Report (Signed)
[name], [name]            ACCOUNT NO.:  [id number]   MEDICAL RECORD NO.:  [id number]          PATIENT TYPE:  INP   LOCATION:  6531                         FACILITY:  [facility]   PHYSICIAN:  Charlies Constable, M.D. [facility] DATE OF BIRTH:  1936/01/15   DATE OF PROCEDURE:  12/29/2004  DATE OF DISCHARGE:  12/30/2004                              CARDIAC CATHETERIZATION   PROCEDURE PERFORMED:  Percutaneous coronary intervention.   CARDIOLOGIST:  Charlies Constable, M.D.   CLINICAL HISTORY:  [name] is 75 years old and has no prior history  of known heart disease who was admitted with unstable angina.  She was  studied earlier today by Dr. Antoine Poche and was found to have a mildly tight  lesion in the oropharynx LAD.  She has other problems including diabetic  neuropathy and she is seen in the pain clinic at [facility].   DESCRIPTION OF PROCEDURE:  The procedure was performed via the right femoral  artery using a 3.5 Guiding catheter with side holes.  The patient was given  Angiomax bolus and infusion, and had been previously taking Plavix.  We  crossed the lesion in the proximal LAD with a ProWater wire without  difficulty.  We direct-stented with a 3.0 x 18 mm Cypher stent deploying  with a balloon inflation (one inflation) of 16 atmospheres for 30 seconds.  We postdilated with a 4.0 x 12 mm Quantum Maverick, following two inflations  at 15 atmospheres for 30 seconds.  We felt the proximal reference diameter  was about 4 and the distal reference diameter was about 3.25.   There was TIMI II flow following stent deployment, so intracoronary  verapamil was given and this restored TIMI III flow.   The patient tolerated the procedure well and left the laboratory in  satisfactory condition.   RESULTS:  Initially the stenosis in the proximal LAD was estimated at 80%  and following stenting this improved to 0%.   CONCLUSION:  Successful stenting of the lesion in the proximal left  anterior  descending artery using a Cypher drug-eluting stent with improvement in  percent narrowing from 80% to 0%.   DISPOSITION:  The patient was taken to the post-anesthesia care unit for  further observation.      BB/MEDQ  D:  12/29/2004  T:  12/30/2004  Job:  [id number]   cc:   Ramon Dredge L. Juanetta Gosling, M.D.  [address]  [place]  [place] [address]  Fax: [phone]   Rollene Rotunda, M.D.   Cardiopulmonary Laboratory

## 2011-01-26 NOTE — Discharge Summary (Signed)
NAME:  Monica Garner, Monica Garner            ACCOUNT NO.:  [id number]   MEDICAL RECORD NO.:  [id number]          PATIENT TYPE:  INP   LOCATION:  A335                          FACILITY:  [facility]   PHYSICIAN:  Edward L. Juanetta Gosling, M.D.DATE OF BIRTH:  20-Jul-1936   DATE OF ADMISSION:  08/09/2004  DATE OF DISCHARGE:  12/01/2005LH                                 DISCHARGE SUMMARY   REASON FOR ADMISSION:  1.  Hypoglycemia.  2.  Diabetes.  3.  Chronic back pain.  4.  Status post stroke.  5.  Hypertension.  6.  Gastroesophageal reflux disease.   Monica Garner is a 75 year old who had just been discharged from the  hospital with an episode of hypoglycemia and was given instructions on the  sliding scale of Humulin R.  She had checked her blood sugar as instructed  and then took insulin as instructed, but did not eat and developed  hypoglycemia again.  She presented to the emergency room where she had a low  blood sugar, which was treated, but did not stay up.   PHYSICAL EXAMINATION:  GENERAL:  By the time I saw her, she was awake and  alert.  CHEST:  Clear.  HEART:  Regular.  ABDOMEN:  Soft.  EXTREMITIES:  No edema.   HOSPITAL COURSE:  She was treated with D5W.  Her blood sugar stayed well  over 200.  She did not get any sliding scale insulin because of concern that  she would drop her blood sugar again.  By the next morning, she said that  she felt better and was discharged home to continue with her previous  medications, including the sliding scale insulin, but she was instructed  that she definitely needed to eat when she took her insulin.     Edwa   ELH/MEDQ  D:  08/10/2004  T:  08/11/2004  Job:  [id number]
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
## 2011-01-26 NOTE — H&P (Signed)
NAME:  Monica Garner, Monica Garner            ACCOUNT NO.:  [id number]   MEDICAL RECORD NO.:  [id number]          PATIENT TYPE:  INP   LOCATION:  4729                         FACILITY:  [facility]   PHYSICIAN:  Jesse Sans. Wall, M.D.   DATE OF BIRTH:  [birth year]   DATE OF ADMISSION:  02/08/2005  DATE OF DISCHARGE:                                HISTORY & PHYSICAL   CHIEF COMPLAINT:  My heart pain occurred off and on all day with walking.Marland Kitchen   HISTORY OF PRESENT ILLNESS:  Monica Garner is a 75 year old white female  with numerous cardiac risk factors, status post stent to an 80% LAD on December 29, 2004.  She had normal circumflex, normal right coronary artery.  She had  normal left ventricular systolic function by a previous echocardiogram.   She has had recurrent angina off and on all day, worse with walking.  She  did not have nitroglycerin at home.   Her chest pain was relieved at [facility] ER with nitroglycerin.  EKG showed  an old right bundle, left anterior fascicular block, with no acute changes.  She is currently here at [facility] with reflux symptoms but no angina.   PAST MEDICAL HISTORY:  1.  COPD.  2.  Ongoing tobacco use.  3.  History of a GI bleed from aspirin, followed by Dr. Victorino Dike.  4.  Type 2 diabetes.  5.  Obesity.  6.  History of a stroke.  7.  Mild renal insufficiency with a creatinine tonight of 1.3, old right      bundle, left anterior fascicular block.   CURRENT MEDICATIONS:  1.  Humulin insulin 70/30 on a sliding scale.  2.  Gabapentin 300 mg three times a day.  3.  Glucovance 2.5 and 500 once a day.  4.  Prilosec 40 mg daily.  5.  Vytorin 10/20 daily.  6.  Demadex 100 mg a day.  7.  Plavix 75 mg once a day.  8.  Klor-Con 10 mEq a day.  9.  Celexa 40 mg daily.  10. Zelnorm 6 mg twice a day.  11. Halcion 0.25 mg q.h.s. to sleep.  12. Stool softener.  13. Zyrtec D 12 mg twice a day.   SOCIAL HISTORY:  She lives in [place].  She is a widow.  She has 5 children.  She denies any alcohol or illicit drugs.  She continues to smoke heavily.   FAMILY HISTORY:  Mother died at age 69 with congestive heart failure.  Father died at age 46 with congestive heart failure.  Brother died at 75 of  heart failure.   REVIEW OF SYSTEMS:  Overall positive.  She has multiple somatic complaints.  She is a very difficult historian.  See the HPI for the above.   PHYSICAL EXAMINATION:  GENERAL APPEARANCE:  She is in no acute distress.  Skin is warm and dry.  She is elderly, with currently some reflux symptoms.  VITAL SIGNS:  Blood pressure was 119/59, pulse 97 and regular, respiratory  rate 20, O2 saturation 95%, temperature 98.0.  HEENT:  Normocephalic and atraumatic.  Sclerae are  clear.  Pupils equal,  round and responsive to light and accommodation.  Extraocular movements  intact.  NECK:  Supple.  No JVD.  Bilateral soft carotid bruits.  CHEST:  Decreased breath sounds throughout with some mild rhonchi.  HEART:  Regular rate and rhythm without murmurs.  S2 split, and widely split  with inspiration.  ABDOMEN:  Soft with good bowel sounds.  No obvious hepatosplenomegaly.  EXTREMITIES:  Reveal 1+ pretibial edema.  Pulses are 1+ in the dorsalis  distribution.  No bruits are noted.   EKG shows sinus rhythm with a right bundle left anterior fascicular block.  There are no acute changes.   LABORATORY DATA:  Her creatinine is 1.3, BUN 27, potassium 4.1, glucose 128.  Hemoglobin 11.7, white count 6.8, platelet count 298.  Cardiac enzymes -his  point of care markers were negative x1.  No pro time was drawn.  She is on  IV heparin.   ASSESSMENT:  1.  Unstable angina with known coronary artery disease, status post stent to      the left anterior descending, December 29, 2004.  2.  Chronic obstructive pulmonary disease with ongoing tobacco use.  3.  Type 2 diabetes.  4.  Obesity.  5.  History of a stroke.  6.  Mild renal insufficiency.  7.  Right bundle branch block and  left anterior fascicular block.  8.  Aspirin intolerance.  9.  History of a gastrointestinal bleed.  10. Gastroesophageal reflux.   PLAN:  1.  IV heparin.  2.  Continue current medications, but hold Glucovance.  3.  Hydration.  4.  Check pro time.  5.  IV nitroglycerin.  6.  Diagnostic catheterization tomorrow.  Indication, risks, potential      benefits discussed with the patient.  7.  She was told to stop smoking.       TCW/MEDQ  D:  02/08/2005  T:  02/09/2005  Job:  [id number]   cc:   Vida Roller, M.D.  Fax: [phone]   Oneal Deputy. Juanetta Gosling, M.D.  [address]  [place]  [place] [address]  Fax: [phone]

## 2011-01-26 NOTE — Cardiovascular Report (Signed)
NAME:  [name], [name]            ACCOUNT NO.:  [id number]   MEDICAL RECORD NO.:  [id number]          PATIENT TYPE:  INP   LOCATION:  4729                         FACILITY:  [facility]   PHYSICIAN:  Arvilla Meres, M.D. LHCDATE OF BIRTH:  Nov 26, 1935   DATE OF PROCEDURE:  02/09/2005  DATE OF DISCHARGE:  02/10/2005                              CARDIAC CATHETERIZATION   PRIMARY CARE PHYSICIAN:  Ramon Dredge L. Juanetta Gosling, M.D.   CARDIOLOGIST:  Vida Roller, M.D.   PATIENT IDENTIFICATION:  Monica Garner is a very pleasant 75 year old  woman, status post PCA and stenting of her LAD on December 29, 2004, who has  experienced recurrent chest pain in the setting of cigarette smoking.  She  was admitted for unstable angina and left heart catheterization.   PROCEDURES PERFORMED:  1.  Selective coronary angiography.  2.  Left heart catheterization.  3.  Left ventriculogram.   DESCRIPTION OF PROCEDURE:  The risks and benefits of catheterization were  explained to Ms. Reid;  consent was signed and placed on the chart.  A  six French arterial sheath was placed in the right femoral artery using the  modified Seldinger technique.  Standard catheters, including a JL4, JR4, and  a bent pigtail were used for the procedure.  All catheter exchanges were  made over a wire.  There were no apparent complications.  At the end of the  procedure, the patient was transferred to the holding area in stable  condition for removal of her vascular access.   FINDINGS:  Central aortic pressure 129/66, with a mean of 93.  LV pressure  was 115/10/20.   Coronary anatomy:  The left main was normal.   LAD was a long vessel that wrapped the apex.  It gave off a single mid  diagonal which was small.  There was a stent in the proximal to mid section  of the LAD just prior to the take-off of the diagonal.  Prior to the stent,  there was a focal 30 to 40% stenosis.  Within the stent, there was minor 20%  re-stenosis.   Left circumflex was a large, codominant vessel.  It gave off a very tiny OM1  and a large branching OM2.  It also gave off two small posterior lateral's.  There was no angiographic CAD.   Right coronary artery was a moderate sized co-dominant vessel.  It gave off  a large branching nodal branch as well as small to moderate sized PDA.  There was no angiographic CAD.   Left ventriculogram done in the RAO position showed an EF of approximately  60% with no wall motion abnormalities, mitral regurgitation.   ASSESSMENT:  1.  Left anterior descending stent is patent with 20% in-stent and 30 to 40%      lesion prior to the stent.  There is no flow-limiting disease.  2.  Otherwise, normal coronaries.  3.  Normal left ventricular function.   RECOMMENDATIONS:  Would recommend continued medical therapy with continued  Plavix use and smoking cessation.  She will be suitable for discharge home  in the morning.  DB/MEDQ  D:  02/09/2005  T:  02/11/2005  Job:  [id number]   cc:   Ramon Dredge L. Juanetta Gosling, M.D.  [address]  [place]  [place] [address]  Fax: [phone]   Vida Roller, M.D.  Fax: [phone]   [facility] [place] office

## 2011-01-26 NOTE — Group Therapy Note (Signed)
   NAME:  Monica Garner, Monica Garner                      ACCOUNT NO.:  [id number]   MEDICAL RECORD NO.:  [id number]                   PATIENT TYPE:  INP   LOCATION:  A218                                 FACILITY:  [facility]   PHYSICIAN:  Kofi A. Gerilyn Pilgrim, M.D.              DATE OF BIRTH:  1935/12/14   DATE OF PROCEDURE:  DATE OF DISCHARGE:                                   PROGRESS NOTE   The patient has had a dramatic improvement in her symptoms.  She essentially  is back to baseline with no residual problems.  She is awake, alert and she  converses very fluently with intact comprehension and naming.  She follows  commands briskly and bilaterally.  Extraocular movements are full.  Visual  fields are intact. She has no pronator drift.  Coordination is normal.  The  reflexes are +2 with downgoing plantar reflexes.   EEG was done and it was mildly abnormal with mild left hemispheric slowing.   ASSESSMENT/PLAN:  Likely transient ischemic attack/small left hemispheric  stroke.   We will follow the MRI report.  She is also to continue taking Plavix.   Echo, I believe, was unremarkable.  Carotids are still pending.                                                 Kofi A. Gerilyn Pilgrim, M.D.    KAD/MEDQ  D:  05/28/2003  T:  05/29/2003  Job:  [id number]

## 2011-01-26 NOTE — H&P (Signed)
NAME:  Monica Garner, Monica Garner            ACCOUNT NO.:  [id number]   MEDICAL RECORD NO.:  [id number]          PATIENT TYPE:  INP   LOCATION:  IC05                          FACILITY:  [facility]   PHYSICIAN:  Melvyn Novas, MDDATE OF BIRTH:  May 05, 1936   DATE OF ADMISSION:  04/01/2005  DATE OF DISCHARGE:  LH                                HISTORY & PHYSICAL   HISTORY OF PRESENT ILLNESS:  This patient is a 75 year old white lady who  apparently lives alone.  The only history I have is that she was found  unresponsive by some friends who brought her in.  They are not currently  available for further history.  In reviewing the old medical chart she had a  history of a cerebrovascular accident and insulin-dependent diabetes  mellitus as well as coronary artery disease with a stent and multiple other  medical abnormalities. She is on a host of medications, many of which may be  contributory to an obtunded, lethargic state, however, consideration is  given to cerebrovascular accident.  It was attempted to get an MRI in the  emergency room, however, the patient was too restless and this was  abandoned.  We will do this at our earliest convenience.  I am reluctant to  sedate her now. She was given 2 amps of Narcan with no response.  Drug test  is positive for opiates and benzodiazepines which are listed on her  medication sheet.  She does appear clinically volume depleted with dry oral  mucous membranes and will be admitted with frequent neurological checks,  obtain MRI and monitoring of electrolytes, fluid balance, as well as  glucose's.   PAST MEDICAL HISTORY:  Significant for diabetes, history of cerebrovascular  accident, gastroesophageal reflux disease, hiatal hernia, hypertension,  coronary artery disease with a stent.  She had a history of arrhythmia,  low back pain, spinal stenosis, chronic obstructive pulmonary disease,  hyperlipidemia and systolic function is normal with ejection  fraction of  65%.  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   SOCIAL HISTORY:  She smokes two packs per day, continues to smoke.  Ethanol  is negative.  She has a 100 pack year history of smoking.   CURRENT MEDICATIONS:  1.  Xanax 1 mg t.i.d. PRN.  2.  Glucovance 2.5 mg/500 mg b.i.d.  3.  Plavix 75 mg daily.  4.  Phenergan 25 mg PRN.  5.  Zelnorm 6 mg b.i.d.  6.  Paxil 20 mg daily.  7.  Celexa 30 daily.  8.  Prilosec 20 daily.  9.  Reglan 5 mg t.i.d.  10. Demodex 100 mg per day.  11. Nitroglycerin PRN.  12. Pindolol 5 mg b.i.d.  13. Neurontin 300 mg t.i.d.  14. Lopressor 25 mg b.i.d.  15. Methadone, unknown dosage.   PHYSICAL EXAMINATION:  VITAL SIGNS:  Blood pressure 154/98, pulse 69 and  regular.  She is afebrile. Respiratory rate is 28.  HEENT: Head is normocephalic, atraumatic.  Eyes: Pupils equal, round,  reactive to light. Extraocular movements intact.  Sclerae clear.  Conjunctiva pink.  Patient has dry oral mucosa.  NECK:  Shows no carotid bruits, no thyromegaly, no lymphadenopathy.  LUNGS:  Show prolonged expiratory phase, diminished breath sounds at the  bases.  No rales, wheezes or rhonchi appreciable.  HEART:  Regular rate and rhythm with 1/6 aortic outflow murmur.  No S3, S4,  gallops, heaves, thrills or rubs.  ABDOMEN:  Soft, nontender, bowel sounds present normoactive, no rebound, no  masses, no megaly.  Rectal was Hemoccult negative.  EXTREMITIES:  Trace pedal edema.  NEUROLOGICAL:  Patient moves all four extremities.  She writhes.  Cranial  nerves II-XII grossly intact.  Patient does not cooperate with hand grip.  Plantar's show bilateral upgoing plantar responses as well as withdrawal.  She does respond to pain in all four extremities.   IMPRESSION:  1.  Altered mental status, obtundation, rule out cerebrovascular accident.  2.  Polypharmacy possibly contributory.  3.  Clinical volume  depletion.  4.  Status post cerebrovascular accident.  5.  History of coronary artery disease with a stent.  6.  Chronic obstructive pulmonary disease.  7.  Hypertension.  8.  Increased liver function tests with SGOT of 68 and SGPT of 44.  9.  Hypokalemia with potassium of 3.0 and creatinine of 1.8, BUN 34.   PLAN:  Admit, serial glucose's, serial BMP's, obtain MRI at earliest  convenience.  Watch for respiratory embarrassment.  Give drug  holiday of  most of her drugs to see if she becomes more alert.  I will make further  recommendations as the database expands.       RMD/MEDQ  D:  04/01/2005  T:  04/01/2005  Job:  [id number]

## 2011-01-26 NOTE — Procedures (Signed)
   NAME:  Monica Garner, Monica Garner                      ACCOUNT NO.:  [id number]   MEDICAL RECORD NO.:  [id number]                   PATIENT TYPE:  INP   LOCATION:  A218                                 FACILITY:  [facility]   PHYSICIAN:  Kofi A. Gerilyn Pilgrim, M.D.              DATE OF BIRTH:  [birth year]   DATE OF PROCEDURE:  DATE OF DISCHARGE:                                EEG INTERPRETATION   HISTORY:  The patient is a 75 year old lady who is having acute confusional  state.   ANALYSIS:  A 16-channel recording is conducted for approximately 20 minutes.  There is a posterior rhythm of 6-7 Hz on the left and 8-9 Hz on the right.  There is beta activity seen in the frontal areas.  Awake and drowsy activity  is seen.  Photic stimulation does not elicit any abnormal responses.  There  is no clear epileptiform activity seen.  The quality of the recording is  somewhat marginal as there was frequent high-voltage, high-frequency  artifact.   IMPRESSION:  Abnormal recording due to left hemispheric slowing; however,  there is no epileptiform activity.  The hemispheric slowing may be due to  cerebral dysfunction such as infarct.                                                 Kofi A. Gerilyn Pilgrim, M.D.    KAD/MEDQ  D:  05/28/2003  T:  05/28/2003  Job:  [id number]

## 2011-01-26 NOTE — H&P (Signed)
NAME:  Monica Garner, Monica Garner                      ACCOUNT NO.:  [id number]   MEDICAL RECORD NO.:  [id number]                   PATIENT TYPE:  INP   LOCATION:  IC04                                 FACILITY:  [facility]   PHYSICIAN:  Edward L. Juanetta Gosling, M.D.             DATE OF BIRTH:  1936-06-04   DATE OF ADMISSION:  05/27/2003  DATE OF DISCHARGE:                                HISTORY & PHYSICAL   REASON FOR ADMISSION:  Stroke.   HISTORY:  This is a 75 year old who has a long known history of multiple  medical problems including diabetes mellitus, hypertension and history of  cardiac disease.  She had been in her usual state of fairly poor health at  home and apparently was concerned that her blood sugar was low, called EMS  and when they arrived, her blood sugar was about 60.  She was having trouble  actually checking her blood sugar.  She was given D-50-W but did not make  any change in her responsiveness.  She was brought to the emergency room,  where she was noted to be alert and moving but she was unable to really  respond to commands and spoke in gibberish.  She would occasionally respond  with an automatic response.  She has not had, to my knowledge, a previous  stroke, but she does have a history of diabetes and hypertension.  She says  that she has had a myocardial infarction, about eight months ago, and I  believe that history is in error, but she was admitted to the hospital at  [facility] and had a cardiac workup which I believe was negative for  infarction.  She has had a fractured hip and has been using a walker at  home.  She has had a recent burn on her left hip area and down her leg and I  do not recall how that happened; she has been started on Silvadene cream in  the last 48 hours for that.   MEDICATIONS:  She is on a number of medications at home but I do not know  the doses at this point.  She lists Celexa, Flexeril, Xanax, OxyContin,  Prevacid, Sonata, nitroglycerin,  Norvasc, insulin 70/30 and Humalog.   ALLERGIES:  She is allergic to ASPIRIN.   FAMILY HISTORY:  Her family history is really not obtainable at this time  and I do not have her old records yet.   SOCIAL HISTORY:  She does live alone.  She has a previous smoking history;  she has been attempting to stop smoking.  I do not believe she consumes any  alcohol.   REVIEW OF SYSTEMS:  Review of systems is really not obtainable either.   PHYSICAL EXAMINATION:  VITAL SIGNS:  Her physical exam shows pulse is about  100 and regular, blood pressure 141/61, her temperature 96.7, respirations  24; O2 saturation is 99%.  GENERAL:  She has vomited several times  since she has come to the hospital.  HEENT:  Her pupils are reactive to light and accommodation.  I cannot see  her fundi because she will not hold still.  Mucous membranes are slightly  dry.  CHEST:  Her chest shows some rhonchi bilaterally.  HEART:  Heart is regular without gallop.  ABDOMEN:  Her abdomen is soft, no masses are felt.  EXTREMITIES:  She has a burn on her left hip and down the left calf.   ASSESSMENT:  I think she has had a stroke, although her CT scan was  negative.  She is diabetic and we will have to follow that closely.  I am  going to go ahead and put her on some Decadron because of all the nausea,  with concerns that she might be developing some increased intracranial  pressure, although that was not seen on her CT.  I am going to ask for a  speech evaluation and neurology consultation.  She will probably require  something for her blood pressure.  I am going to leave her on the Silvadene  cream.  We are going to use a sliding-scale insulin, put her on Protonix  intravenously, Ativan as needed for (p.r.n.) agitation and try Zofran for  nausea.                                               Edward L. Juanetta Gosling, M.D.    ELH/MEDQ  D:  05/27/2003  T:  05/27/2003  Job:  [id number]

## 2011-01-26 NOTE — Group Therapy Note (Signed)
NAME:  Monica Garner, Monica Garner            ACCOUNT NO.:  [id number]   MEDICAL RECORD NO.:  [id number]          PATIENT TYPE:  INP   LOCATION:  A222                          FACILITY:  [facility]   PHYSICIAN:  Edward L. Juanetta Gosling, M.D.DATE OF BIRTH:  [birth year]   DATE OF PROCEDURE:  02/26/2005  DATE OF DISCHARGE:                                   PROGRESS NOTE   PROBLEM:  1.  Coronary disease.  2.  Chronic obstructive pulmonary disease.  3.  Status post fall.  4.  Hypertension.  5.  Diabetes.   SUBJECTIVE:  Monica Garner says she is feeling better, but she is having  trouble with a catheter.   OBJECTIVE:  VITAL SIGNS:  Her exam shows her temperature is 98 pulse 64,  respirations 22, blood sugar 256, blood pressure 156/68.  GENERAL:  She still has diffuse aches, but she is not having any significant  chest pain.   Her white blood count is 8,200 today, hemoglobin 11, platelets 327. Renal  function is okay with a creatinine 1.4, BUN of 42.   ASSESSMENT:  She has not had a significant decline in renal function from  her fall and presumed myonecrosis from the fall.   PLAN:  My plan then is to continue with her treatments. She is to have a  cardiology consultation today and follow up there. I have encouraged her to  seek some sort of an assisted living center. I do not think it is safe for  her to live at home, and I have asked for help with the discharge planning.       ELH/MEDQ  D:  02/26/2005  T:  02/26/2005  Job:  [id number]

## 2011-01-26 NOTE — Discharge Summary (Signed)
NAME:  Monica Garner, Monica Garner            ACCOUNT NO.:  [id number]   MEDICAL RECORD NO.:  [id number]          PATIENT TYPE:  INP   LOCATION:  A222                          FACILITY:  [facility]   PHYSICIAN:  Edward L. Juanetta Gosling, M.D.DATE OF BIRTH:  Jun 01, 1936   DATE OF ADMISSION:  02/23/2005  DATE OF DISCHARGE:  06/20/2006LH                                 DISCHARGE SUMMARY   DISCHARGE DIAGNOSES:  1.  Fall, probably due to overtaking medications.  2.  Elevated muscle enzymes related to the fall.  3.  Coronary artery occlusive disease status post stent placement.  4.  Type-1 diabetes mellitus.  5.  Anxiety and depression.  6.  Chronic pain syndrome.  7.  Hypertension.  8.  Dysuria.  9.  History of cerebrovascular accident.  10. Mild renal insufficiency.   HISTORY:  Monica Garner is a 75 year old who had a stent placed in her LAD  in April 2006.  She has been in her usual state of fairly poor health at  home.  She had EMS called because she had fallen and was in essence  unresponsive.  When she was brought to the emergency room she had elevation  of some of her cardiac enzymes but not troponin.  She was felt to have had a  syncopal episode.  I felt that this was probably related to overtaking her  medications which had been a problem for her in the past.  After she became  arousable she freely admits that she had overtaken her medication.   PHYSICAL EXAMINATION:  On admission, showed a well-developed, well-nourished  female who is in no acute distress, sluggish but responsive to some extent.  Her heart was regular.  Her abdomen soft.  Extremities showed no edema.  CNS  once she woke up was grossly intact.   HOSPITAL COURSE:  She had serial cardiac enzymes which were negative for  infarction.  Consultation with [facility] team who felt that she did  not have an acute MI, and she improved.  By the time of discharge she was  improved enough that she could go home.  I had told her that I  thought she  should live in an assisted living center; she does not want to do that, but  says that she will consider it in the future if she continues to have  problems.  She is going to try to do something better with her medications  as far as managing her medications.   DISPOSITION:  She is discharged home to continue her medications which are  1.  Humulin insulin 70/30 on a sliding scale.  2.  Gabapentin 300 mg three times a day.  3.  Glucovance 2.5/500 once a day.  4.  Prilosec 40 mg daily.  5.  Vytorin 10/20 daily.  6.  Demadex 100 mg daily.  7.  Plavix 75 mg daily.  8.  Potassium chloride 10 mEq daily.  9.  Celexa 40 mg daily.  10. Zelnorm 6 mg b.i.d.  11. Halcion 0.25 q.h.s. p.r.n. sleep, which I do not think she is actually      on.  She will have home health to see her and will plan to follow up after that.       ELH/MEDQ  D:  02/27/2005  T:  02/27/2005  Job:  [id number]

## 2011-01-26 NOTE — Group Therapy Note (Signed)
NAME:  Monica Garner, TOLLE            ACCOUNT NO.:  [id number]   MEDICAL RECORD NO.:  [id number]          PATIENT TYPE:  INP   LOCATION:  IC05                          FACILITY:  [facility]   PHYSICIAN:  Edward L. Juanetta Gosling, M.D.DATE OF BIRTH:  April 07, 1936   DATE OF PROCEDURE:  04/02/2005  DATE OF DISCHARGE:                                   PROGRESS NOTE   PROBLEM:  Change in mental status.   SUBJECTIVE:  Monica Garner came in yesterday with change in mental status.  In the past, she has overtaken her medications and I suspect that is what  has happened again.  She has been calling my office asking for increased  doses of pain medications which I refused. She called my office last week  stating that she wanted to be in a an assisted-living center and then  changed her mind and actually denied ever calling the office when we called  her back.  She has poorly controlled diabetes, poorly-controlled anxiety and  depression and has had a previous episode of poor responsiveness.  At this  point, she says that she does need to be in an assisted-living center, but  insists that she cannot do that in leaving from the hospital this time.   OBJECTIVE:  GENERAL:  This morning, she is awake and alert and  neurologically intact.  VITAL SIGNS:  Her temperature is 98, blood pressure about 100/70,  respirations 12, O2 saturations 94% on 2 L.  Her pulse is 80-90.   ASSESSMENT:  I am concerned that she has again overtaken her medications.  She keeps saying that she has significant problems with her nerves that no  one understands her problems, so I am going to go ahead and ask for a  consultation with the ACT team.   PLAN:  We will see if we can get her out of the intensive care unit.  Her  blood sugars are better so I am going to stop the D-5 normal saline and see  if we can get her up.  Ask for social service consultation.       ELH/MEDQ  D:  04/02/2005  T:  04/02/2005  Job:  [id number]

## 2011-01-26 NOTE — H&P (Signed)
NAME:  Monica Garner, Monica Garner NO.:  [id number]   MEDICAL RECORD NO.:  [id number]          PATIENT TYPE:  EMS   LOCATION:  ED                            FACILITY:  [facility]   PHYSICIAN:  Vida Roller, M.D.   DATE OF BIRTH:  12/07/1935   DATE OF ADMISSION:  12/28/2004  DATE OF DISCHARGE:                                HISTORY & PHYSICAL   Mrs. Bourbeau is a woman I follow in the cardiology clinic who has  multiple cardiac risk factors but no known coronary artery disease.  She  presented this morning for a dobutamine Cardiolite scan and unfortunately  began to have discomfort in her chest characteristic for her previous  discomfort.  It responded to nitroglycerin.  She had no obvious  electrocardiographic changes but the pain was not completely resolved with a  sublingual nitroglycerin and the decision was made to transport her to the  emergency department where she was evaluated and stabilized.  The discomfort  in her chest started as she was getting prepared to be started with an IV.  The IV was successfully placed but the discomfort continued.  She has had  now about an hour's worth of discomfort which has been treated with  sublingual nitroglycerin but not resolved.  She denies any PND or orthopnea.  She is an active tobacco abuse, has severe COPD which is progressive due to  her active tobacco abuse.  She is evaluated in my office on December 15, 2004,  at which time she described the discomfort in her chest which was not  associated with radiation but was exacerbated by physical activity.  The  shortness of breath was particularly disquieting to her associated with some  of her orthopedic issues as well and she appears to have this discomfort on  a relatively regular basis.   Her past cardiac history is significant for multiple evaluations for chest  discomfort.  She has had echocardiograms which showed normal left  ventricular systolic function, most recently back in  June of 2004, the last  time I saw her, we did an echocardiogram  showed normal LV function with no  significant valvular heart disease.  She had an adenosine Cardiolite at that  time which showed normal perfusion with an ejection fraction of 60%.  She  has diabetes mellitus which requires sliding scale insulin as well as  Glucophage.  She has hypertension which is poorly control.  She has COPD  which is progressive and severe.  She has hyperlipidemia.  She has history  of a CVA.  She is intolerant to aspirin due to an upper GI bleed a number of  years ago. She is followed by Ulyess Mort, M.D., for this but is on  Plavix for the history of the CVA.   CURRENT MEDICATIONS:  1.  Humulin insulin 70/30 on a sliding scale.  2.  Gabapentin 300 mg three times a day.  3.  Glucovance 2.5 and 500 once a day.  4.  Nexium 40 mg once a day.  5.  Vytorin 10 and 20 once a day.  6.  Demodex 100  mg once a day.  7.  Plavix 75 mg once a day.  8.  Klor-Con 10 mEq once a day.  9.  Celexa 40 mg once a day.  10. Zelnorm 6 mg twice a day.  11. Halcion 0.25 mg q.h.s. to sleep.  12. Stool softener.  13. Zyrtec D 12 mg twice a day.  14. She takes Xanax, oxycodone, Benadryl and hydrocodone syrup on an as      needed basis.   SOCIAL HISTORY:  She is an active tobacco user.  Has probably almost a 100-  pack-year smoking history.  [place]  She is a  widow.  She has five children, three of whom are living.  She denies any  alcohol or illicit drugs.   FAMILY HISTORY:  Mother died at age 77 of congestive heart failure.  Father  died at age 68 of congestive heart failure.  She has a brother who died at  age 33 of congestive heart failure.  Sister who is alive and well at age 95  and another at age 7 who she does not encounter. She has three living  children, one child died in an automobile accident.  The other one, I am not  sure.   REVIEW OF SYMPTOMS:  Generally positive.  She  has multiple somatic  complaints.  She is a very difficult historian.  However, there is no  specific concern for any positive review of systems as this has been a  consistent finding with her over the course of the last few evaluations.   PHYSICAL EXAMINATION:  GENERAL APPEARANCE:  She is an elderly white female  who is in very mild distress secondary to her chest discomfort.  VITAL SIGNS:  Heart rate is 74, respiratory rate 14, blood pressure 146/70.  She is afebrile.  HEENT:  Normal for her age.  NECK:  Supple.  She has no jugular venous distension.  She has bilateral  soft carotid bruits.  CHEST:  She has decreased breath sounds throughout her lung fields with an  increased expiratory phase.  There is no active wheezing currently.  She has  some rhonchi at the right base.  CARDIOVASCULAR:  Regular with no obvious murmurs.  ABDOMEN:  Soft and nontender with normal active bowel sounds.  No obvious  hepatosplenomegaly.  EXTREMITIES:  Her lower extremities and upper extremities have multiple  small ecchymotic areas which she says are from the Plavix.  Her pulses are  1+ in her upper extremities and quite diminished in her lower extremities.  She does not have any bruits noted.   Her electrocardiogram shows sinus rhythm with a right bundle branch block  and a left anterior fascicular block.  Ventricular rate of 74, PR interval  of 168, QR restoration of 140 msec and her QT corrected is 519.  She has a  left axis at -64.  No obvious ischemic STT wave changes.  Essentially  unchanged from an EKG from December 15, 2004, when I saw her in the office.   Laboratories and chest x-ray are pending.   This is a woman with multiple cardiac risk factors who has ongoing chest  discomfort which is responsive to nitroglycerin  who, unfortunately, we  cannot do a perfusion study because of her ongoing chest discomfort.  I think it is probably incumbent upon [place] to address this problem directly with  her  multiple risk factors and her ongoing tobacco abuse. There certainly is  a multitude  of causes for this discomfort.  My concern is with the abnormal  electrocardiogram which is, as well chronic, that we are likely to have  difficulty with recurrent negative noninvasive studies.  I think she needs a  definitive study to insure that she does not have significant coronary  artery disease.  Her COPD is a problem with the ongoing active tobacco abuse  that is managed by her pulmonary primary care doctor and she needs active  tobacco cessation.  I am going to treat with IV nitroglycerin, Lovenox,  aspirin, continue her  Plavix.  Will probably use 81 mg of aspirin.  Transport her to [facility]. [facility]. Will get a heart catheterization completed.  She will  need a chest x-ray when she gets there and she will probably need inpatient  tobacco cessation counseling.      JH/MEDQ  D:  12/28/2004  T:  12/28/2004  Job:  [id number]   cc:   Ramon Dredge L. Juanetta Gosling, M.D.  [address]  [place]  [place] [address]  Fax: [phone]

## 2011-01-26 NOTE — Group Therapy Note (Signed)
NAME:  Monica Garner, Monica Garner            ACCOUNT NO.:  [id number]   MEDICAL RECORD NO.:  [id number]          PATIENT TYPE:  INP   LOCATION:  A217                          FACILITY:  [facility]   PHYSICIAN:  Edward L. Juanetta Gosling, M.D.DATE OF BIRTH:  11/24/1935   DATE OF PROCEDURE:  DATE OF DISCHARGE:                                   PROGRESS NOTE   PROBLEM:  Change in mental status probably secondary unintentional over use  of medication, diabetes, coronary disease, hypertension, anxiety and  depression.   SUBJECTIVE:  Mrs. Spare says she feels great and wants to go home.  She  has no new complaints.  We are working on placement.  She does need to be  somewhere where her medications can be monitored.   Her exam shows that her temperature is 98.1, pulse 93, respirations 20.  Blood sugar is 260.  Blood pressure 116/61.  O2 sats 97%.  Her chest is  clear.  Her heart is regular.  She looks much better in general.   Assessment is that she is improved.   Plan is for discharge home.  Please see discharge summary for details.       ELH/MEDQ  D:  04/03/2005  T:  04/03/2005  Job:  [id number]

## 2011-01-26 NOTE — Discharge Summary (Signed)
[name], [name]            ACCOUNT NO.:  [id number]   MEDICAL RECORD NO.:  [id number]          PATIENT TYPE:  INP   LOCATION:  6531                         FACILITY:  [facility]   PHYSICIAN:  Olga Millers, M.D. LHCDATE OF BIRTH:  1936-06-23   DATE OF ADMISSION:  12/28/2004  DATE OF DISCHARGE:  12/30/2004                           DISCHARGE SUMMARY - REFERRING   PRIMARY CARE PHYSICIAN:  Ramon Dredge L. Juanetta Gosling, M.D.   HISTORY OF PRESENT ILLNESS:  Ms. Garner is a 75 year old female who  presented for an outpatient stress test at [facility] for  evaluation of chest discomfort.  Upon presentation for her stress test, she  reported ongoing chest discomfort.  She was given one sublingual  nitroglycerin with relief of symptoms.  [name] was then admitted to  [facility] for further evaluation with catheterization.  She  underwent this on December 29, 2004.  She was found to have 80% stenosis of the  LAD that was treated with a stent reducing her lesion to 0%.  Ms. Youngman  did well postprocedure.  She was seen on December 30, 2004, by Dr. Olga Millers who felt she was doing well and was stable for discharge.  She  denied any recurrence of her chest discomfort.  Her vital signs were stable,  and her groin revealed no hematoma and no bruit.   LABORATORY DATA:  White blood cell count within normal limits today.  Chemistry revealed sodium 132, potassium 4.2, chloride 88, CO2 34, glucose  460, BUN 27, creatinine 1.2, calcium 8.5.  She did have a mildly elevated  creatinine prior to catheterization at 1.6.  Her Demadex was held.  Cardiac  enzymes were negative x3 per acute myocardial infarction.  Lipase was within  normal limits.   RADIOLOGY:  Chest x-ray revealed stable cardiomegaly, mild changes of COPD  with no acute abnormality.  Electrocardiogram on admission revealed a sinus  rhythm at 73 beats per minute with right bundle branch block which was  chronic.  EKG  on December 29, 2004, revealed sinus rhythm with left anterior  fascicular block, left ventricular hypertrophy with QRS widening.  Nonspecific ST abnormalities included inverted T waves in lead 3.   DISCHARGE INSTRUCTIONS:  1.  Diet:  Low-fat, low-cholesterol diet.  2.  Wound Care:  Clean cath site with soap and water.  3.  Call [phone] with an problems with cath site.  4.  Activity:  No driving, heavy lifting, sexual activity or heavy exertion      x1 week.   DISCHARGE MEDICATIONS:  1.  Plavix 75 mg daily.  2.  Nexium 40 mg daily.  3.  Klor-Con 10 mEq daily.  4.  Vytorin 10/20 mg daily.  5.  Demadex 100 mg daily.  6.  Gabapentin 300 mg t.i.d.  7.  Celexa 40 mg daily.  8.  Zelnorm 6 mg b.i.d.  9.  Halcion 0.25 mg q.h.s.  10. Zyrtec 12 mg b.i.d.  11. Insulin as prior to admission.  12. She is instructed to resume Glucovance on Sunday, December 31, 2004.   FOLLOWUP:  Dr. Dorethea Clan in two weeks.  Office will call for an appointment.  She is also to come to Dr. Marchelle Folks office on Monday for a basic metabolic  panel.   DISCHARGE DIAGNOSES:  1.  Coronary artery disease 80% stenosis reduced to 0% status post stent in      her left anterior descending.  2.  Renal insufficiency.  B-met on Monday as above.  3.  Diabetes.  4.  Chronic obstructive pulmonary disease.  5.  History of cerebrovascular accident.      AB/MEDQ  D:  12/30/2004  T:  12/31/2004  Job:  [id number]   cc:   Ramon Dredge L. Juanetta Gosling, M.D.  [address]  [place]  [place] [address]  Fax: [phone]   Vida Roller, M.D.  Fax: [phone]

## 2011-01-26 NOTE — Cardiovascular Report (Signed)
Monica Garner, Monica Garner            ACCOUNT NO.:  [id number]   MEDICAL RECORD NO.:  [id number]          PATIENT TYPE:  INP   LOCATION:  6531                         FACILITY:  [facility]   PHYSICIAN:  Rollene Rotunda, M.D.   DATE OF BIRTH:  1936-06-05   DATE OF PROCEDURE:  12/29/2004  DATE OF DISCHARGE:  12/30/2004                              CARDIAC CATHETERIZATION   PRIMARY CARE PHYSICIAN:  Ramon Dredge L. Juanetta Gosling, MD.   CARDIOLOGIST:  Vida Roller, MD.   PROCEDURE:  Left heart catheterization/coronary arteriography.   INDICATIONS:  Evaluate patient with unstable angina.   PROCEDURAL NOTE:  Left heart catheterization was performed via the right  femoral artery.  The artery was cannulated using an anterior wall puncture.  A #6-French arterial sheath was inserted via the modified Seldinger  technique.  Pre-formed Judkins catheters were utilized.  The patient  tolerated the procedure well and left the lab in stable condition.   RESULTS:   HEMODYNAMICS:  LV 133/40, AO 128/62.   CORONARIES:  The left main was normal.  The LAD had a mid 80% stenosis.  There was a small, mid diagonal, which was normal.  The circumflex in the AV  groove was normal.  There was a mid obtuse marginal, which was large,  branching and normal.  The right coronary artery was dominant and normal.  The PDA was moderate sized and normal.   LEFT VENTRICULOGRAM:  A left ventriculogram was not obtained secondary to  mild renal insufficiency.  We did cross the valve for pressures.   CONCLUSION:  Single vessel coronary artery disease.   PLAN:  The patient will have percutaneous revascularization with PTCA and  stent per Dr. Juanda Chance.      JH/MEDQ  D:  12/29/2004  T:  12/30/2004  Job:  [id number]   cc:   Ramon Dredge L. Juanetta Gosling, M.D.  [address]  [place]  [place] [address]  Fax: [phone]   Vida Roller, M.D.  Fax: [phone]

## 2011-01-26 NOTE — Group Therapy Note (Signed)
NAME:  SAYANA, SALLEY            ACCOUNT NO.:  [id number]   MEDICAL RECORD NO.:  [id number]          PATIENT TYPE:  INP   LOCATION:  A222                          FACILITY:  [facility]   PHYSICIAN:  Edward L. Juanetta Gosling, M.D.DATE OF BIRTH:  [birth year]   DATE OF PROCEDURE:  02/24/2005  DATE OF DISCHARGE:                                   PROGRESS NOTE   SUBJECTIVE:  Ms. Mordecai was admitted yesterday with a fall. Had somewhat  elevated cardiac enzymes on the point of care enzymes done in the emergency  room. Since then, her total CK gone over 4,000. Her CK-MB is about 190, but  her troponin is fairly low. She still complains of pain all over. She has  several bruises.   PHYSICAL EXAMINATION:  VITAL SIGNS:  Her exam shows her temperature is 97.3,  pulse 73, respirations 20, blood sugar 242, blood pressure 108/48.  CHEST:  Her chest is fairly clear.  HEART:  Heart is regular.  ABDOMEN:  Soft.   Lab work as mentioned.   ASSESSMENT/PLAN:  I have discussed this with Dr. Berton Mount of [facility]  cardiology who feels that this is unlikely to represent acute MI. Her EKG  shows a right bundle branch block and left anterior hemiblock it is  difficult to tell. I agree with Dr. Odessa Fleming assessment, though, that this is  not a typical pattern for an acute MI. She has a stent in place, and I would  expect her to have a very high troponin if she was actually infarcting.  Believe this is skeletal muscle.   My plan then would be to go ahead with current treatments medications. No  changes today and ask for [facility] cardiology consultation on Monday,  continue supportive care in the meantime. Neurologically, she appears intact  today.       ELH/MEDQ  D:  02/24/2005  T:  02/24/2005  Job:  [id number]

## 2011-01-26 NOTE — Consult Note (Signed)
NAME:  Monica Garner, Monica Garner            ACCOUNT NO.:  [id number]   MEDICAL RECORD NO.:  [id number]          PATIENT TYPE:  INP   LOCATION:  A222                          FACILITY:  [facility]   PHYSICIAN:  Vida Roller, M.D.   DATE OF BIRTH:  1935/11/01   DATE OF CONSULTATION:  02/27/2005  DATE OF DISCHARGE:                                   CONSULTATION   CARDIOLOGY CONSULTATION   HISTORY OF PRESENT ILLNESS:  [name] is a 75 year old woman well  known to me from the clinic who has recently undergone a relatively  extensive cardiac work up including a heart catheterization. She had been  admitted back in April with unstable angina and ended up having a  significant obstruction in her left anterior descending coronary artery, was  treated with a Cypher stent with a significantly reduced luminal diameter  and an excellent angiographic result.  She has done well until June when she  developed some discomfort in her chest. She was evaluated again.  Heart  catheterization showed nonobstructive disease and normal left ventricular  systolic function. She has ongoing tobacco abuse, chronic obstructive  pulmonary disease, diabetes, has a history of cerebrovascular accident in  the past and mild renal insufficiency with an abnormal electrocardiogram.  It appears that she was getting out of bed to go to the bathroom and fell,  injured herself quite significantly and was taken to the emergency  department at [facility] where she was admitted.  She states that  she does have discomfort in her chest which is relatively consistent and  ongoing and really has been like that since she had a stent placed in her  heart, but this discomfort does not appear to be exertionally related.  Mrs.  Monica Garner has relatively impressive chronic obstructive pulmonary disease  and the thought is that this is likely the etiology for this problem.   SOCIAL HISTORY/HABITS:  She lives in [place] by herself. She is  a widow.  She  has three living children who are actively involved her health care.  She  has an extensive tobacco history and used to be a 2 pack a day smoker. She  quit after her stent was placed in her heart and really has been without  cigarettes since May of 2006.  She does not drink alcohol or use illicit  drugs.   MEDICATIONS PRIOR TO ADMISSION:  1.  Humulin 70/30 on a sliding scale.  2.  Lopressor 25 mg b.i.d.  3.  Glucovance 2.5/500 once a day.  4.  Neurontin 300 three times daily.  5.  Prilosec 40 mg once a day.  6.  Vytorin 10 and 20 once a day.  7.  Demodex 100 mg once a day.  8.  Plavix 75 mg once a day.  9.  Potassium chloride 10 mEq once a day.  10. Celexa 40 mg once a day.  11. Zelnorm 6 mg twice a day.  12. Nitroglycerin as needed.  13. She takes OxyContin on a three times a day basis as well as Lortab every      6 hours and  with suspect that may be one of the reasons why she fell is      that she was a little bit over-medicated with her pain medications.   FAMILY HISTORY:  Her mother died in 27's of congestive heart failure.  Father died in his 39's of congestive heart failure.   REVIEW OF SYMPTOMS:  She denies any fever or chills.  She denies any  headaches, sinus tenderness.  She does have bruising on her arms, probably  from the fall. She does not have any rashes. She dos have this constant  chest discomfort but no shortness of breath or dyspnea on exertion. No  paroxysmal nocturnal dyspnea or orthopnea.  No lower extremity edema.  She  does have a cough which is nonproductive.  She denies any urinary frequency  or urgency.  She does have relatively substantial weakness in her legs with  numbness in her feet and arthralgias in her lower extremities and also some  arthritis in her back.  She denies any nausea, vomiting, diarrhea, bright  red blood per rectum or melena and she denies any polyuria or polydipsia.  The remainder of her review of systems is  negative.   PHYSICAL EXAMINATION:  GENERAL APPEARANCE:  She is an elderly white female  in no apparent distress.  She is alert and oriented X4.  VITAL SIGNS:  She is afebrile. Pulse is 63. Respirations are 20. Blood  pressure is 143/73.  She is saturating 97% on room air.  HEENT:  Examination of head, ears, eyes nose and throat shows a bruise on  her left cheek which is slightly tender.  There does not appear to be any  crepitus however.  Her pupils are equal, round, reactive and the remainder  of her HEENT examination is unremarkable.  NECK:  Her neck is supple. She has no jugular venous distention or carotid  bruits.  CARDIAC:  Examination is regular.  She has a 2/6 systolic ejection murmur  but no lifts or thrills.  First and second heart sounds are normal and her  point of maximal impulse is not displaced.  LUNGS:  Are clear to auscultation bilaterally.  She has ecchymoses in her  bilateral upper extremities, right greater than left, probably from her  fall.  ABDOMEN:  Soft, nontender with normal active bowel sounds.  Genitourinary  and rectal examinations are deferred.  EXTREMITIES:  Her lower extremities are without cyanosis, clubbing or edema.  Her pulses are 1+.  Her cath site is well healed.  NEUROLOGICAL:  Examination is grossly nonfocal.   CLINICAL DATA:  She had a head CT scan which showed on fracture, no acute  intracranial findings.  She has mild atrophy with soft tissue swelling in  her left forehead.  CT scan of her spine shows no fracture, only  degenerative changes.  She has an old healed mandibular fracture with some  thickening of her cervical and upper thoracic esophagus which I do not know  the etiology of.  Electrocardiogram shows sinus with rate 81 with a left  anterior fascicular block, a right bundle branch block, left axis deviation.  Her QRS duration is about 128 milliseconds. Her PR interval is 149.  QT corrected is 476.  She has left ventricular  hypertrophy by voltage.  This is  essentially unchanged from her electrocardiogram done when she was at [facility].   LABORATORY DATA:  White blood cell count 8.2, H/H 11 and 32 with platelet  count of 327,000.  Sodium 132, potassium 4.1, chloride is 96, bicarb 27, BUN  42, creatinine 1.4 and blood sugar is 113.  Liver function studies are all  within normal limits except a slightly elevated alkaline phosphatase, total  protein 7.3, albumin 3.9.  Cardiac enzymes - she has relatively markedly  elevated CK's with MB fractions which are less than the index of 3.0 and her  troponin's are all very low level, 0.03, 0.06 and 0.05.  Urinalysis shows a  little bit of white blood cells, a little bit of red blood cells, otherwise  unremarkable.   DISCUSSION:  This is a woman who had a fall and appears to not be a syncopal  episode and appears to not have anything to do directly with her cardiac  axis.  She does have the abnormal cardiac enzymes but they look to be muscle  trauma as opposed to an acute myocardial infarction.  She has recently had a  heart catheterization which was relatively reassuring.  She does have  chronic obstructive pulmonary disease which is relatively substantial.  Fortunately she stopped smoking and she has an anemia which appears to be  relatively new.  We looked at her discharge numbers and her hematocrit  dropped from 38 to 32 over the course of about a month's period of time and  I believe it is worthwhile investing this further and discussed that with  her primary doctor.  I think the plan is to discharge her to home without  any further cardiac work up which I think is very reasonable.  She has  expressed an interest in potentially going to a skilled nursing facility and  I think I would support that.  Will see her back in follow up in the next  month or so in the cardiology clinic.       JH/MEDQ  D:  02/27/2005  T:  02/27/2005  Job:  [id number]

## 2011-01-26 NOTE — Discharge Summary (Signed)
NAME:  Monica Garner, Monica Garner            ACCOUNT NO.:  [id number]   MEDICAL RECORD NO.:  [id number]          PATIENT TYPE:  INP   LOCATION:  4729                         FACILITY:  [facility]   PHYSICIAN:  Joellyn Rued, P.A. [facility] DATE OF BIRTH:  [birth year]   DATE OF ADMISSION:  02/08/2005  DATE OF DISCHARGE:  02/10/2005                           DISCHARGE SUMMARY - REFERRING   DISCHARGE PHYSICIAN:  Vida Roller, MD.   SUMMARY OF HISTORY:  Ms. Florance is a 75 year old white female, who  presents with reoccurring exertional chest discomfort intermittently  throughout the day of admission.  She did not have any nitroglycerin at home  in that she had not received any discharge or followup from the office.  When she presented to [facility], she received nitroglycerin,  which resolved her discomfort.  Her EKG did not show any acute changes.  Given her recent stent to the LAD on December 29, 2004, for chest discomfort,  she was referred to [facility] for further evaluation.   PAST MEDICAL HISTORY:  1.  COPD.  2.  Ongoing tobacco use.  3.  GI bleed secondary from aspirin followed by Dr. Victorino Dike.  4.  Type 2 diabetes.  5.  Obesity.  6.  Remote CVA.  7.  Mild renal insufficiency with a creatinine of 1.3.  8.  Chronic right bundle branch block.  9.  Left anterior fascicular block.   LABORATORY DATA:  Chest x-ray did not show any acute disease.  EKGs did not  show any acute changes.  Admission weight was 188.4.  At [facility], H&H was  1.7 and 34.9, normal indices, platelets 298, WBC 6.8.  Subsequent  hematologies were unremarkable.  At the time of discharge, H&H was 11.1 and  33.4, normal indices, platelets 271, WBC is 5.2.  PTT 123. PT 12.2.  Sodium  130, potassium 4.1, BUN 27, creatinine 1.3, glucose 128.  Prior to  discharge, sodium is 137, potassium 3.4, BUN 20, creatinine 1.2, glucose  347.  CK-MBs and troponins x3 were negative for myocardial infarction.   HOSPITAL  COURSE:  Ms. Lauderbaugh was admitted to 4700 and placed on IV  heparin as well as her home medications.  Overnight, she did not have any  further chest discomfort, but she complained of nausea and vomiting, which  appears to be chronic in nature.  It was felt that given her recent stent  and her chest discomfort, she should undergo cardiac catheterization.  A  beta blocker was added to her medical regimen given her history.  She also  received educational material on smoking cessation, catheterization, and  cholesterol.  A tobacco cessation consult was performed, and Cardiac Rehab  assisted with education and ambulation.  Catheterization performed on February 09, 2005, by Dr. Gala Romney showed an EF of 60%.  Her stent was patent in the  LAD with a 20% restenosis.  She did have a slight 30-40% stenosis proximal  to the stent.  Dr. Gala Romney felt that her discomfort was not related to her  recent stenting of her LAD.  He felt that she could probably be discharged  on June 3rd.  By June 3rd, she was feeling well.  She did not have any  further chest discomfort.  Catheterization site was intact.  Dr. Dorethea Clan  noted that the patient refused to quit smoking.  He also assessed her  hyperlipidemia and also felt that the patient was very noncompliant with her  medications.   DISCHARGE DIAGNOSES:  1.  Chest discomfort of uncertain etiology.  Catheterization did not show      any progressive coronary artery disease.  2.  Chronic obstructive pulmonary disease with continued tobacco use.  3.  Normocytic anemia.  4.  ASPIRIN INTOLERANCE secondary to gastrointestinal bleed.  5.  Hyperglycemia with a history of diabetes, unknown when her last      hemoglobin A1c was; however, it seems to be poorly controlled.  6.  Hyperlipidemia, unknown last check.  7.  Status post CYPHER stenting to the left anterior descending in April      2005, history as previously.   DISPOSITION:  Her new medications include:  1.   Sublingual nitroglycerin 0.4 mg p.r.n.  2.  Lopressor 25 mg p.o. b.i.d.  3.  She was asked to resume Glucovance 2.5/500 mg daily on Monday and to      continue her sliding scale 70-30 insulin coverage.  4.  Neurontin 300 mg t.i.d.  5.  Prilosec 40 mg daily.  6.  Vytorin 10/20 mg daily.  7.  Demadex 100 mg daily.  8.  Plavix 75 mg daily.  9.  Chlorcon 10 mEq daily.  10. Celexa 40 mg daily.  11. Zelnorm 6 mg b.i.d.  12. Zyrtec-D 12 mg b.i.d.   FOLLOWUP:  1.  She was asked to call Dr. Marchelle Folks office on Monday to arrange a four-      week appointment with Jae Dire.  2.  She was also asked to arrange a followup appointment with her primary      care physician, Dr. Juanetta Gosling for sugar control as well as to encourage to      discontinue smoking.  6.      EW/MEDQ  D:  02/10/2005  T:  02/11/2005  Job:  [id number]

## 2011-01-26 NOTE — Group Therapy Note (Signed)
NAME:  ARDENA, GANGL            ACCOUNT NO.:  [id number]   MEDICAL RECORD NO.:  [id number]          PATIENT TYPE:  INP   LOCATION:  A222                          FACILITY:  [facility]   PHYSICIAN:  Edward L. Juanetta Gosling, M.D.DATE OF BIRTH:  09-28-1935   DATE OF PROCEDURE:  02/25/2005  DATE OF DISCHARGE:                                   PROGRESS NOTE   PROBLEM:  1.  Coronary disease.  2.  Chronic obstructive pulmonary disease.  3.  Diabetes.  4.  Syncopal episode   SUBJECTIVE:  Ms. Moehle says she is doing a little better. She has less  pain than she did earlier. She has no new complaints.   OBJECTIVE:  VITAL SIGNS:  Her physical examination today shows that her  temperature is 97.3, pulse is 70, respirations 60, blood pressure 132/62.  CHEST:  Her chest is much clearer than before.  HEART:  Her heart is regular.  ABDOMEN:  Her abdomen is soft. She is complaining of bringing up some  greenish sputum, so I told her we will go ahead and start her on antibiotics  for that.  CENTRAL NERVOUS SYSTEM:  Her CNS is grossly intact.   ASSESSMENT/PLAN:  As mentioned in my progress note yesterday, I discussed  her situation with the cardiology team. My plan then is to go ahead and have  the cardiologist here see her tomorrow. Continue with her medications and  treatments. Check a BMET tomorrow to make sure that she has not had some  rhabdomyolysis causing problems with her renal function. Add Levaquin p.o.  Add Humibid LA 2 b.i.d. and follow.       ELH/MEDQ  D:  02/25/2005  T:  02/26/2005  Job:  [id number]
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
## 2011-01-26 NOTE — Procedures (Signed)
   NAME:  CERENITI, CURB                      ACCOUNT NO.:  [id number]   MEDICAL RECORD NO.:  [id number]                   PATIENT TYPE:  INP   LOCATION:  IC04                                 FACILITY:  [facility]   PHYSICIAN:  Vida Roller, M.D.                DATE OF BIRTH:  01-Apr-1936   DATE OF PROCEDURE:  05/27/2003  DATE OF DISCHARGE:                                  ECHOCARDIOGRAM   TAPE NUMBER:  [id number].   TAPE COUNT:  [id number].   INDICATIONS FOR PROCEDURE:  This is a 75 year old female with a  cerebrovascular accident in the Intensive Care Unit.   TECHNICAL QUALITY:  Technical quality of the study is extremely difficult.   2-D AND DOPPLER IMAGING:  The left ventricle appears to be normal size with  normal systolic function. There are no obvious wall motion abnormalities.  There is no obvious aortic stenosis. There is no obvious mitral stenosis.  There appears to be a small pericardial effusion versus a fat pad. The  inferior vena cava is small. Most of the images that were diagnostic were  from the subcostal view.                                               Vida Roller, M.D.    JH/MEDQ  D:  05/27/2003  T:  05/27/2003  Job:  [id number]

## 2011-01-26 NOTE — Group Therapy Note (Signed)
   NAME:  Monica Garner, CARBONELL                      ACCOUNT NO.:  [id number]   MEDICAL RECORD NO.:  [id number]                   PATIENT TYPE:  INP   LOCATION:  A218                                 FACILITY:  [facility]   PHYSICIAN:  Mila Homer. Sudie Bailey, M.D.           DATE OF BIRTH:  29-May-1936   DATE OF PROCEDURE:  DATE OF DISCHARGE:                                   PROGRESS NOTE   The patient is feeling better.  She tells me 3 days before her stroke she  felt her blood was getting too thin, was noted when she checked her  fingerstick glucoses to monitor diabetes.  She stopped the Plavix about 3  days before.   Usually at home, she is on OxyContin 40 mg t.i.d. for severe arthritic pains  and alprazolam 1 mg p.o. q.i.d.  She is ready to institute these today.  She  slept well last night on Ambien 10 mg at h.s.   She continues on:  1. Pantoprazole 40 mg IV q.24h. with amlodipine 5 mg q.d.  2. Catapres 2 patch q.week.  3. Plavix 75 mg q.d.  4. Sliding scale.  5. NovoLog insulin.  6. Silvadene b.i.d.    OBJECTIVE:  VITAL SIGNS:  Temperature 97.0, pulse 76, respirations 16, blood  pressure 144/74.  GENERAL:  She is sitting up in a chair.  She is oriented and alert, no acute  distress. Well developed, well nourished.  HEENT:  Tongue is midline.  Both pupils are dilated to about 5 mm equally  and responds to light.  LUNGS:  Clear throughout.  HEART:  Regular rhythm, heart rate in the 70s.  EXTREMITIES:  There is no edema of the ankles.  NEUROLOGIC:  Sentence structures intact.  There is no slurring of her  speech.  She has 5/5 grip strength bilaterally.   Glucoses in the last 24 hours were:  201, 253, 257.   ASSESSMENT:  1. She is status post transient ischemic attack.  2. Insulin-dependent diabetes mellitus.  3. Essential hypertension.  4. Severe arthritis.    PLAN:  We are reinstituting OxyContin 40 mg p.o. t.i.d. and alprazolam 1 mg  q.i.d.  Continue with the Plavix, which is  restarted after her event.                                                Mila Homer. Sudie Bailey, M.D.    SDK/MEDQ  D:  05/29/2003  T:  05/29/2003  Job:  [id number]

## 2011-01-26 NOTE — Consult Note (Signed)
Monica Garner, Monica Garner            ACCOUNT NO.:  [id number]   MEDICAL RECORD NO.:  [id number]         PATIENT TYPE:  AMB   LOCATION:                                FACILITY:  [facility]   PHYSICIAN:  Lionel December, M.D.    DATE OF BIRTH:  [birth year]   DATE OF CONSULTATION:  DATE OF DISCHARGE:                                   CONSULTATION   REFERRING PHYSICIAN:  Dr. Sherryll Burger.   REASON FOR CONSULTATION:  Dysphagia.   HISTORY OF PRESENT ILLNESS:  Monica Garner is a 75 year old Caucasian  female who told me she has had intermittent dysphagia to both solids and  liquids over the last several years.  This has become progressively worse.  She points to her upper esophagus and says that her food just lays there.  She has also had regurgitation of undigested food.  She complains of daily  heartburn and indigestion despite the fact that she is on Prilosec 20 mg  b.i.d.  She has daily nausea as well for which she takes p.r.n. Phenergan.  She is having vomiting episodes about two or three times a week.  She feels  her symptoms were better controlled on Nexium 40 mg b.i.d.  However, her  insurance would not cover this.  She tells me she is having occasional  constipation.  She might go a couple of days without a bowel movement  followed by two or three bowel movements in a day.  Denies any rectal  bleeding or melena.  Denies any abdominal pain.   PAST MEDICAL HISTORY:  1. She has a history of chronic GERD.  2. She had an EGD by Dr. Karilyn Cota on June 25, 2001 and was found to have      a small sliding hiatal hernia.  She was empirically dilated with 56      [place] Maloney dilator.  She had three colonic polyps at that time      which were tubular adenomas.  She tells me she has not had a follow-up      colonoscopy.  3. She has a history of diabetes mellitus.  4. Asthma.  5. Coronary artery disease.  6. Arthritis.  7. Neuropathy.  8. Bipolar disorder.  9. Chronic hepatitis C, has not been a  candidate for treatment.  10.Chronic low back pain.  11.Coronary artery disease.  12.Congestive heart failure.  13.She is status post hysterectomy.  14.Cholecystectomy.  15.Tubal ligation.  16.A rhinoplasty.   CURRENT MEDICATIONS:  1. Zoloft 50 mg daily.  2. Plavix 75 mg daily.  3. Prozac 10 mg daily.  4. Neurontin 300 mg q.i.d.  5. Demodex 100 mg daily.  6. Ditropan XL 10 mg daily.  7. B12 monthly injections.  8. Loratadine 10 mg daily.  9. Prilosec 20 mg b.i.d.  10.Morphine 60 mg b.i.d.  11.Mucinex 600 mg b.i.d.  12.Lopressor 25 mg daily.  13.Nystatin cream b.i.d.  14.Klor-Con 10 mEq b.i.d.  15.Ambien 10 mg q.h.s.  16.Xanax 1 mg q.i.d.  17.Imodium 80 p.r.n.  18.Phenergan 25 mg q.6h. p.r.n.  19.Nitroglycerin 0.4 mg p.r.n.  20.Humulin NPH 50 units in the  morning and 25 units in the evening.  21.Glyburide 2-1/2 mg b.i.d.   ALLERGIES:  1. ASPIRIN.  2. Questionable ANTI-INFLAMMATORY MEDICATIONS.   FAMILY HISTORY:  There is no known family history of colorectal carcinoma,  liver or chronic GI problems.  Both mother age 30 and father age 61 deceased  secondary to coronary artery disease and MI.  She has lost two children due  to accidents.   SOCIAL HISTORY:  Monica Garner is a widow.  She lives at [facility].  She  has three living, healthy children.  She is retired from [employer].  She  reports she smokes about four to five cigarettes a day for at least 40  years.  She denies any alcohol or drug use.   REVIEW OF SYSTEMS:  CONSTITUTIONAL:  Denies any fatigue, fever, chills.  CARDIOVASCULAR:  Denies any chest pain or palpitations.  PULMONARY:  Denies  any shortness of breath, dyspnea, cough, hemoptysis.  GI:  See HPI.   PHYSICAL EXAMINATION:  VITAL SIGNS:  Weight 188 pounds.  Height 67 inches.  Temperature 97.8, blood pressure 102/60 and pulse 60.  GENERAL:  Monica Garner is a 75 year old wheelchair-bound Caucasian female  who is alert, oriented, pleasant and  cooperative in no acute distress.  HEENT:  Sclerae are clear, nonicteric.  Conjunctivae pink.  Oropharynx pink  and moist without any lesions.  NECK:  Supple without any thyromegaly.  CHEST:  Heart regular rate and rhythm.  Normal S1 and S2 without murmurs,  clicks, rubs or gallops.  LUNGS:  She had expiratory wheezes throughout.  No acute distress.  ABDOMEN:  Protuberant with positive bowel sounds x4.  No bruits auscultated.  Soft, nontender, and nondistended without palpable mass or  hepatosplenomegaly.  No rebound tenderness or guarding.  EXTREMITIES:  Trace lower extremity edema bilaterally.   IMPRESSION:  Monica Garner is a 75 year old Caucasian female with a history  of chronic GERD.  She tells me she is having worsening intermittent  dysphagia to both solids and liquids, most of her symptoms in the upper  esophagus.  She also describes regurgitation as well as refractory heartburn  and indigestion despite the Prilosec.  She has almost daily nausea with  intermittent vomiting.  She is going to require further evaluation with EGD  to rule out complicated gastroesophageal reflux disease.  She also carried  the history of adenomatous colonic polyps and is due for repeat surveillance  at this time.   PLAN:  1. She is going to have EGD with possible esophageal dilatation and      colonoscopy with Dr. Lionel December in the near future.  I have      discussed both the procedures including risks and benefits which      include but are not limited to infection, perforation, drug reaction.      She agrees with plan and consent will be obtained.  She is going to      hold her Plavix for four days prior to the procedure, take half of her      insulin and Glyburide the day prior to and of the procedure.  2. I have given her a prescription for Nexium 40 mg b.i.d. #60 with two      refills.  As she has failed omeprazole 20 mg b.i.d. for greater than a      month.  3. Further recommendations  pending procedure.  I would like to thank Dr. Sherryll Burger for allowing [place] to participate in the care of  Monica Garner.      Nicholas Lose, N.P.      Lionel December, M.D.  Electronically Signed    KC/MEDQ  D:  05/21/2006  T:  05/21/2006  Job:  [id number]   cc:   Kirstie Peri, MD  Fax: [phone]

## 2011-03-20 ENCOUNTER — Ambulatory Visit (INDEPENDENT_AMBULATORY_CARE_PROVIDER_SITE_OTHER): Payer: Medicare Other | Admitting: Internal Medicine

## 2011-03-20 DIAGNOSIS — R110 Nausea: Secondary | ICD-10-CM

## 2011-03-20 DIAGNOSIS — R1310 Dysphagia, unspecified: Secondary | ICD-10-CM

## 2011-03-20 DIAGNOSIS — Z7901 Long term (current) use of anticoagulants: Secondary | ICD-10-CM

## 2011-03-21 NOTE — Consult Note (Unsigned)
NAME:  Monica Garner, Monica Garner                 ACCOUNT NO.:  MEDICAL RECORD NO.:  [id number]  LOCATION:                                 FACILITY:  PHYSICIAN:  Lionel December, M.D.    DATE OF BIRTH:  01-22-1936  DATE OF CONSULTATION: DATE OF DISCHARGE:                                CONSULTATION   HISTORY OF PRESENT ILLNESS:  Monica Garner is a 75 year old white female resident of the [facility] in [place].  She tells me she has continued to vomit 2-3 times a day.  Her vomiting or nausea will occur before or after she eats.  The nausea will occur especially in the morning.  The nausea is occurring with any foods that she eats.  She denies any problems swallowing foods.  She does tell me that it feels like foods are lodging in her lower esophagus, but they will eventually go down. Her dysphagia actually started in January after her last visit in December.  Her appetite is good.  She tells me her bowel movements are dark brown.  She has at least 1-2 a day.  She also tells me her last colonoscopy was 1 year ago by Dr. Karilyn Cota, and it was normal and I will try to find that report.  She also complains of frequent acid reflux. She was last seen in our office in December for nausea.  Labs were drawn on August 11, 2010.  Her hemoglobin was 10.6, hematocrit was 35.5, her MCV was 73.8, her sodium was 134, potassium 5.0, chloride 95, her glucose 130, AST 20, and ALT was 9.  Her iron 29, TIBC 458, UIBC 429, and percent of saturation was 6.  Her vitamin B12 was 191, folate 10 and 13 with a ferritin, Dr. Olena Leatherwood was aware of these numbers.  I did ask the staff to guaiac stools x3 for me, but those results were never returned evidently.  She is allergic to ARTHROTEC, ASPIRIN, and SULFA.  PAST SURGICAL HISTORY:  Includes a hysterectomy.  MEDICAL HISTORY:  Hypertension.  She had been a diabetic type 2 for greater than 25 years, history of depression, bipolar, GERD, coronary artery disease, chronic back pain,  headaches, and an irregular heart.  FAMILY HISTORY:  Her mother is deceased from an MI and her father is deceased from an MI.  She has 2 sisters and their health is unknown. They are not living in this area.  She has one brother deceased from an MI.  She is divorced.  She does not smoke, drink, or do drugs.  She says she has 5 children, 3 are in good health; one is deceased at age 6 and one son was murdered at age 69.  HOME MEDICATIONS: 1. Fenofibrate 200 mg at night. 2. Zestril 10 mg a day. 3. Plavix 75 mg a day. 4. Lasix 20 mg twice a day. 5. Omeprazole 20 mg b.i.d. 6. Abilify 20 mg a day. 7. Zofran as needed. 8. Fish oil one a day. 9. NovoLog sliding scale. 10.Levemir 30 units at night. 11.Loratadine 10 mg a day. 12.Iron 325 a day. 13.Fluoxetine 40 mg a day. 14.MS Contin 60 mg b.i.d. 15.Metformin 500 mg b.i.d. 16.Xanax 0.5  t.i.d. 17.Neurontin 300 mg one q.i.d. 18.Calcium one three times a day. 19.Tylenol as needed.  OBJECTIVE:  VITAL SIGNS:  Her height is 5 feet 7 inches, weight is 187, her previous weight was 193, her blood pressure is 102/60, temp 97.9, and pulse 76. HEENT:  She has dentures.  Her oral mucosa is moist.  Her conjunctivae are pink.  Her sclerae are anicteric.  Her thyroid is normal.  There is no cervical lymphadenopathy. LUNGS:  Clear. HEART:  Regular rate and rhythm. ABDOMEN:  Soft.  Bowel sounds are positive.  No masses.  ASSESSMENT:  Monica Garner is a 75 year old female presenting today with nausea and solid food dysphagia.  She also has anemia.  Peptic ulcer disease needs to be ruled out.  RECOMMENDATIONS:  Would like to schedule an EGD, ED with Dr. Karilyn Cota in the near future.  The risk and benefits were reviewed with the patient and she is agreeable.    ______________________________ Dorene Ar, NP   ______________________________ Lionel December, M.D.    TS/MEDQ  D:  03/20/2011  T:  03/21/2011  Job:  [id number]

## 2011-05-02 MED ORDER — SODIUM CHLORIDE 0.45 % IV SOLN
Freq: Once | INTRAVENOUS | Status: AC
  Administered 2011-05-03: 14:00:00 via INTRAVENOUS

## 2011-05-03 ENCOUNTER — Ambulatory Visit (HOSPITAL_COMMUNITY)
Admission: RE | Admit: 2011-05-03 | Discharge: 2011-05-03 | Disposition: A | Discharge status: Home / Self Care | Payer: Medicare Other | Source: Ambulatory Visit | Attending: Internal Medicine | Admitting: Internal Medicine

## 2011-05-03 ENCOUNTER — Encounter (HOSPITAL_COMMUNITY)
Admission: RE | Disposition: A | Discharge status: Home / Self Care | Payer: Self-pay | Source: Ambulatory Visit | Attending: Internal Medicine

## 2011-05-03 ENCOUNTER — Encounter (HOSPITAL_COMMUNITY): Payer: Self-pay | Admitting: *Deleted

## 2011-05-03 ENCOUNTER — Encounter (INDEPENDENT_AMBULATORY_CARE_PROVIDER_SITE_OTHER): Payer: Medicare Other | Admitting: Internal Medicine

## 2011-05-03 DIAGNOSIS — R110 Nausea: Secondary | ICD-10-CM | POA: Insufficient documentation

## 2011-05-03 DIAGNOSIS — E119 Type 2 diabetes mellitus without complications: Secondary | ICD-10-CM | POA: Insufficient documentation

## 2011-05-03 DIAGNOSIS — Z01812 Encounter for preprocedural laboratory examination: Secondary | ICD-10-CM | POA: Insufficient documentation

## 2011-05-03 DIAGNOSIS — R112 Nausea with vomiting, unspecified: Secondary | ICD-10-CM

## 2011-05-03 DIAGNOSIS — R1310 Dysphagia, unspecified: Secondary | ICD-10-CM

## 2011-05-03 DIAGNOSIS — K208 Other esophagitis: Secondary | ICD-10-CM

## 2011-05-03 DIAGNOSIS — K219 Gastro-esophageal reflux disease without esophagitis: Secondary | ICD-10-CM

## 2011-05-03 DIAGNOSIS — Z794 Long term (current) use of insulin: Secondary | ICD-10-CM | POA: Insufficient documentation

## 2011-05-03 DIAGNOSIS — K449 Diaphragmatic hernia without obstruction or gangrene: Secondary | ICD-10-CM | POA: Insufficient documentation

## 2011-05-03 DIAGNOSIS — R1319 Other dysphagia: Secondary | ICD-10-CM | POA: Insufficient documentation

## 2011-05-03 HISTORY — DX: Major depressive disorder, single episode, unspecified: F32.9

## 2011-05-03 HISTORY — PX: SAVORY DILATION: SHX5439

## 2011-05-03 HISTORY — DX: Dorsalgia, unspecified: M54.9

## 2011-05-03 HISTORY — DX: Atherosclerotic heart disease of native coronary artery without angina pectoris: I25.10

## 2011-05-03 HISTORY — DX: Depression, unspecified: F32.A

## 2011-05-03 HISTORY — DX: Headache: R51

## 2011-05-03 HISTORY — PX: ESOPHAGOGASTRODUODENOSCOPY: SHX5428

## 2011-05-03 HISTORY — PX: MALONEY DILATION: SHX5535

## 2011-05-03 HISTORY — DX: Gastro-esophageal reflux disease without esophagitis: K21.9

## 2011-05-03 HISTORY — DX: Cardiac arrhythmia, unspecified: I49.9

## 2011-05-03 HISTORY — DX: Other chronic pain: G89.29

## 2011-05-03 LAB — GLUCOSE, CAPILLARY: Glucose-Capillary: 263 mg/dL — ABNORMAL HIGH (ref 70–99)

## 2011-05-03 SURGERY — EGD (ESOPHAGOGASTRODUODENOSCOPY)
Anesthesia: Moderate Sedation

## 2011-05-03 MED ORDER — MIDAZOLAM HCL 5 MG/5ML IJ SOLN
INTRAMUSCULAR | Status: DC | PRN
  Administered 2011-05-03 (×3): 2 mg via INTRAVENOUS
  Administered 2011-05-03: 1 mg via INTRAVENOUS

## 2011-05-03 MED ORDER — MIDAZOLAM HCL 5 MG/5ML IJ SOLN
INTRAMUSCULAR | Status: AC
  Filled 2011-05-03: qty 10

## 2011-05-03 MED ORDER — BUTAMBEN-TETRACAINE-BENZOCAINE 2-2-14 % EX AERO
INHALATION_SPRAY | CUTANEOUS | Status: DC | PRN
  Administered 2011-05-03: 2 via TOPICAL

## 2011-05-03 MED ORDER — MEPERIDINE HCL 25 MG/ML IJ SOLN
INTRAMUSCULAR | Status: DC | PRN
  Administered 2011-05-03 (×2): 25 mg via INTRAVENOUS

## 2011-05-03 MED ORDER — STERILE WATER FOR IRRIGATION IR SOLN
Status: DC | PRN
  Administered 2011-05-03: 15:00:00

## 2011-05-03 MED ORDER — MEPERIDINE HCL 50 MG/ML IJ SOLN
INTRAMUSCULAR | Status: AC
  Filled 2011-05-03: qty 1

## 2011-05-03 NOTE — H&P (Signed)
Monica Garner is an 75 y.o. female.  Solid food dysphagia; patient is here for EGD and ED  Chief Complaint: Solid food dysphagia; patient is here for EGD and ED  HPI: Patient is 75 year old Caucasian female with multiple medical problems who is cared for Monica Garner in [place], [place]. She has been experiencing food dysphagia for the last few months. She points to lower sternal area as the site of bolus obstruction. Bolus always passes down; she states her heartburn is well controlled with therapy. She also complains of frequent nausea and sporadic vomiting. She generally vomits would but no history of hematemesis or melena. She has history of esophageal ring and her last EGD an ED was in 2007  Past Medical History  Diagnosis Date  . Depression   . GERD (gastroesophageal reflux disease)   . Coronary artery disease   . Diabetes mellitus   . Chronic back pain   . Irregular heart rate     Hx of  . Headache     Past Surgical History  Procedure Date  . Abdominal hysterectomy     History reviewed. No pertinent family history. Social History:  reports that she has never smoked. She does not have any smokeless tobacco history on file. She reports that she does not drink alcohol or use illicit drugs.  Allergies:  Allergies  Allergen Reactions  . Arthrotec (Diclofenac-Misoprostol) Nausea Only  . Aspirin Nausea Only  . Sulfa Antibiotics Rash    Medications Prior to Admission  Medication Dose Route Frequency Provider Last Rate Last Dose  . 0.45 % sodium chloride infusion   Intravenous Once Malissa Hippo, MD 20 mL/hr at 05/03/11 1346    . meperidine (DEMEROL) 50 MG/ML injection           . midazolam (VERSED) 5 MG/5ML injection            Medications Prior to Admission  Medication Sig Dispense Refill  . ALPRAZolam (XANAX) 0.5 MG tablet Take 0.5 mg by mouth 3 (three) times daily.        . ARIPiprazole (ABILIFY) 20 MG tablet Take 20 mg by mouth daily.        . calcium citrate-vitamin D  200-200 MG-UNIT TABS Take 1 tablet by mouth 3 (three) times daily.        . clopidogrel (PLAVIX) 75 MG tablet Take 75 mg by mouth daily.        . fenofibrate micronized (LOFIBRA) 200 MG capsule Take 200 mg by mouth 1 day or 1 dose. Every night       . ferrous fumarate (HEMOCYTE - 106 MG FE) 325 (106 FE) MG TABS Take 1 tablet by mouth daily.        . fish oil-omega-3 fatty acids 1000 MG capsule Take 1,000 mg by mouth 3 (three) times daily.        Marland Kitchen FLUoxetine (PROZAC) 40 MG capsule Take 40 mg by mouth daily.        . furosemide (LASIX) 20 MG tablet Take 20 mg by mouth daily.        Marland Kitchen gabapentin (NEURONTIN) 300 MG capsule Take 300 mg by mouth 4 (four) times daily.        Marland Kitchen guaiFENesin-dextromethorphan (ROBITUSSIN DM) 100-10 MG/5ML syrup Take 10 mLs by mouth 4 (four) times daily as needed.        . insulin aspart (NOVOLOG) 100 UNIT/ML injection Inject into the skin 4 (four) times daily. Sliding scale depending on blood sugar       .  insulin detemir (LEVEMIR) 100 UNIT/ML injection Inject 15 Units into the skin daily.        . insulin detemir (LEVEMIR) 100 UNIT/ML injection Inject 30 Units into the skin at bedtime.        Marland Kitchen lisinopril (PRINIVIL,ZESTRIL) 10 MG tablet Take 10 mg by mouth daily.        Marland Kitchen loratadine (CLARITIN) 10 MG tablet Take 10 mg by mouth daily.        . metFORMIN (GLUCOPHAGE) 500 MG tablet Take 500 mg by mouth 2 (two) times daily with a meal.        . morphine (MS CONTIN) 60 MG 12 hr tablet Take 60 mg by mouth 2 (two) times daily.        Marland Kitchen omeprazole (PRILOSEC) 20 MG capsule Take 20 mg by mouth daily.        . ondansetron (ZOFRAN) 4 MG tablet Take 4 mg by mouth as directed. Every 6 hours prn       . acetaminophen (TYLENOL) 500 MG tablet Take 1,000 mg by mouth every 6 (six) hours as needed.          Results for orders placed during the hospital encounter of 05/03/11 (from the past 48 hour(s))  GLUCOSE, CAPILLARY     Status: Abnormal   Collection Time   05/03/11  1:19 PM       Component Value Range Comment   Glucose-Capillary 263 (*) 70 - 99 (mg/dL)    Comment 1 Notify RN      Comment 2 Documented in Chart      No results found.  Review of Systems  Constitutional: Negative for weight loss.  Gastrointestinal: Positive for nausea and vomiting (sometimes). Negative for heartburn (with omprazole), abdominal pain, diarrhea, constipation, blood in stool and melena.    Blood pressure 147/69, pulse 67, temperature 98 F (36.7 C), temperature source Oral, resp. rate 18, SpO2 97.00%. Physical Exam  Constitutional: She appears well-developed and well-nourished.  HENT:  Mouth/Throat: Oropharynx is clear and moist.  Eyes: Conjunctivae are normal. No scleral icterus.  Neck: No thyromegaly present.  Cardiovascular: Normal rate, regular rhythm and normal heart sounds.   No murmur heard. Respiratory: Breath sounds normal.  GI: Soft. She exhibits no distension and no mass. There is no tenderness.  Musculoskeletal: She exhibits no edema.  Lymphadenopathy:    She has no cervical adenopathy.  Neurological: She is alert.  Skin: Skin is warm and dry.     Assessment/Plan Solid food dysphagia. Chronic GERD. Nausea and vomiting. EGD and possible ED.  Evelia Waskey U 05/03/2011, 2:28 PM

## 2011-05-03 NOTE — Op Note (Signed)
EGD PROCEDURE REPORT  PATIENT:  Monica Garner  MR#:  [id number] Birthdate:  28-Dec-1935, 75 y.o., female Endoscopist:  Dr. Malissa Hippo, MD Referred By:  Dr. Toma Deiters, MD Procedure Date: 05/03/2011  Procedure:   EGD  Indications:  Patient with chronic GERD was intermittent solid food dysphagia, nausea and sporadic vomiting            Informed Consent:  Procedure and risks were reviewed with the patient and informed consent was obtained. Medications:  Demerol 50 mg IV Versed 7 mg IV Cetacaine spray topically for oropharyngeal anesthesia  Description of procedure:  The endoscope was introduced through the mouth and advanced to the second portion of the duodenum without difficulty or limitations. The mucosal surfaces were surveyed very carefully during advancement of the scope and upon withdrawal.  Findings:  Esophagus:  Normal mucosa about erosions, ulcers or stricture GEJ:  38 cm. no ring or stricture noted Hiatus:  40 cm Stomach:  Scant amount of food debris and multiple antral erosions. Pyloric  channel is patent. Duodenum:  Normal bulbar and post bulbar mucosa.  Therapeutic/Diagnostic Maneuvers Performed:  Esophageal dilation performed by passing the 56 [place] Maloney dilator to full insertion but no mucosal disruption noted.  Complications:  None  Impression: Small sliding hiatal hernia without ring or stricture formation. Scant amount of food debris in the stomach with patent pylorus Blood antral erosions.  Recommendations:  And to anti-reflex measures and omeprazole  20 mg twice a day. Resume Plavix and other medications as before. H. pylori serology will be checked today. Consider dropping morphine dose as she may have an element of narcotic introduced gastroparesis.  REHMAN,NAJEEB U  05/03/2011  2:54 PM  CC: Dr. Toma Deiters, MD & Dr. Bonnetta Barry ref. provider found

## 2011-05-09 ENCOUNTER — Encounter (HOSPITAL_COMMUNITY): Payer: Self-pay | Admitting: Internal Medicine

## 2011-05-15 ENCOUNTER — Encounter (INDEPENDENT_AMBULATORY_CARE_PROVIDER_SITE_OTHER): Payer: Self-pay | Admitting: *Deleted

## 2012-06-20 ENCOUNTER — Ambulatory Visit (INDEPENDENT_AMBULATORY_CARE_PROVIDER_SITE_OTHER): Payer: Medicare Other | Admitting: Urology

## 2012-06-20 DIAGNOSIS — N3946 Mixed incontinence: Secondary | ICD-10-CM

## 2012-06-20 DIAGNOSIS — N952 Postmenopausal atrophic vaginitis: Secondary | ICD-10-CM

## 2013-05-17 DIAGNOSIS — I498 Other specified cardiac arrhythmias: Secondary | ICD-10-CM

## 2013-05-18 DIAGNOSIS — I2510 Atherosclerotic heart disease of native coronary artery without angina pectoris: Secondary | ICD-10-CM

## 2015-08-25 ENCOUNTER — Inpatient Hospital Stay (HOSPITAL_COMMUNITY)
Admission: AD | Admit: 2015-08-25 | Payer: Self-pay | Source: Other Acute Inpatient Hospital | Admitting: Internal Medicine

## 2017-09-23 ENCOUNTER — Encounter (INDEPENDENT_AMBULATORY_CARE_PROVIDER_SITE_OTHER): Payer: Self-pay | Admitting: Internal Medicine

## 2017-09-23 ENCOUNTER — Encounter (INDEPENDENT_AMBULATORY_CARE_PROVIDER_SITE_OTHER): Payer: Self-pay

## 2017-10-08 ENCOUNTER — Ambulatory Visit (INDEPENDENT_AMBULATORY_CARE_PROVIDER_SITE_OTHER): Payer: Medicare Other | Admitting: Internal Medicine

## 2017-10-08 ENCOUNTER — Encounter (INDEPENDENT_AMBULATORY_CARE_PROVIDER_SITE_OTHER): Payer: Self-pay | Admitting: *Deleted

## 2017-10-08 ENCOUNTER — Encounter (INDEPENDENT_AMBULATORY_CARE_PROVIDER_SITE_OTHER): Payer: Self-pay | Admitting: Internal Medicine

## 2017-10-08 VITALS — BP 102/62 | HR 60 | Temp 97.7°F | Ht 67.0 in | Wt 180.0 lb

## 2017-10-08 DIAGNOSIS — R1310 Dysphagia, unspecified: Secondary | ICD-10-CM

## 2017-10-08 DIAGNOSIS — R1319 Other dysphagia: Secondary | ICD-10-CM

## 2017-10-08 NOTE — Patient Instructions (Addendum)
DG esophagram.   

## 2017-10-08 NOTE — Progress Notes (Signed)
Subjective:    Patient ID: Monica Garner, female    DOB: 07-14-1936, 82 y.o.   MRN: [id number] Patient is unable to stand HPI  Referred by Dr. Olena Leatherwood for dysphagia. Resident of [facility] in [place]. In 2012 underwent an EGD/ED for intermittent solid food dysphagia. Impression: Small sliding hiatal hernia without ring or stricture formation. Scant amount of food debris in the stomach with patent pylorus Blood antral erosions. Per daughter, patient is having trouble swallowing. Food is backed up. Her appetite good. No weight loss. Has a BM x 1 a day.   Patient is on [address] [place]    Hx  of paroxysmal atrial fib   Review of Systems Past Medical History:  Diagnosis Date  . Chronic back pain   . Coronary artery disease   . Depression   . Diabetes mellitus   . GERD (gastroesophageal reflux disease)   . Headache(784.0)   . Irregular heart rate    Hx of    Past Surgical History:  Procedure Laterality Date  . ABDOMINAL HYSTERECTOMY    . ESOPHAGOGASTRODUODENOSCOPY  05/03/2011   Procedure: ESOPHAGOGASTRODUODENOSCOPY (EGD);  Surgeon: Malissa Hippo, MD;  Location: [facility];  Service: Endoscopy;  Laterality: N/A;  . MALONEY DILATION  05/03/2011   Procedure: Elease Hashimoto DILATION;  Surgeon: Malissa Hippo, MD;  Location: [facility];  Service: Endoscopy;  Laterality: N/A;  . SAVORY DILATION  05/03/2011   Procedure: SAVORY DILATION;  Surgeon: Malissa Hippo, MD;  Location: [facility];  Service: Endoscopy;  Laterality: N/A;    Allergies  Allergen Reactions  . Arthrotec [Diclofenac-Misoprostol] Nausea Only  . Aspirin Nausea Only  . Sulfa Antibiotics Rash    Current Outpatient Medications on File Prior to Visit  Medication Sig Dispense Refill  . ALPRAZolam (XANAX) 0.5 MG tablet Take 0.5 mg by mouth 3 (three) times daily.      Marland Kitchen amLODipine (NORVASC) 5 MG tablet Take 5 mg by mouth daily.    . ARIPiprazole (ABILIFY) 20 MG tablet Take 20 mg by mouth daily.      Marland Kitchen aspirin EC 81 MG  tablet Take 81 mg by mouth daily.    Marland Kitchen atorvastatin (LIPITOR) 10 MG tablet Take 10 mg by mouth daily.    . benazepril (LOTENSIN) 5 MG tablet Take 5 mg by mouth daily.    . canagliflozin (INVOKANA) 100 MG TABS tablet Take by mouth daily before breakfast.    . FLUoxetine (PROZAC) 40 MG capsule Take 40 mg by mouth daily.    Marland Kitchen gabapentin (NEURONTIN) 300 MG capsule Take 300 mg by mouth 3 (three) times daily.    . insulin detemir (LEVEMIR) 100 UNIT/ML injection Inject 45 Units into the skin at bedtime.     . insulin NPH-regular Human (NOVOLIN 70/30) (70-30) 100 UNIT/ML injection Inject 58 Units into the skin daily.    . metFORMIN (GLUCOPHAGE) 1000 MG tablet Take 1,000 mg by mouth 2 (two) times daily with a meal.    . Multiple Vitamin (MULTIVITAMIN) tablet Take 1 tablet by mouth daily.    . potassium chloride (KLOR-CON) 20 MEQ packet Take by mouth 2 (two) times daily.     No current facility-administered medications on file prior to visit.         Objective:   Physical Exam Blood pressure 102/62, pulse 60, temperature 97.7 F (36.5 C), height 5\' 7"  (1.702 m), weight 180 lb (81.6 kg). Alert and oriented. Skin warm and dry. Oral mucosa is moist.   .  Sclera anicteric, conjunctivae is pink. Thyroid not enlarged. No cervical lymphadenopathy. Lungs clear. Heart regular rate and rhythm.  Abdomen is soft. Bowel sounds are positive. No hepatomegaly. No abdominal masses felt. No tenderness.  No edema to lower extremities.    (Patient examined from wheelchair)         Assessment & Plan:  Dysphagia. Am going to get an esophagram.  Further recommendations to follow.

## 2017-10-15 ENCOUNTER — Ambulatory Visit (HOSPITAL_COMMUNITY)
Admission: RE | Admit: 2017-10-15 | Discharge: 2017-10-15 | Disposition: A | Discharge status: Home / Self Care | Payer: Medicare Other | Source: Ambulatory Visit | Attending: Internal Medicine | Admitting: Internal Medicine

## 2017-10-15 ENCOUNTER — Other Ambulatory Visit (INDEPENDENT_AMBULATORY_CARE_PROVIDER_SITE_OTHER): Payer: Self-pay | Admitting: Internal Medicine

## 2017-10-15 DIAGNOSIS — R1319 Other dysphagia: Secondary | ICD-10-CM

## 2017-10-15 DIAGNOSIS — K449 Diaphragmatic hernia without obstruction or gangrene: Secondary | ICD-10-CM | POA: Diagnosis not present

## 2017-10-15 DIAGNOSIS — K222 Esophageal obstruction: Secondary | ICD-10-CM | POA: Diagnosis not present

## 2017-10-15 DIAGNOSIS — R1310 Dysphagia, unspecified: Secondary | ICD-10-CM

## 2017-10-15 DIAGNOSIS — K224 Dyskinesia of esophagus: Secondary | ICD-10-CM | POA: Insufficient documentation

## 2017-10-15 NOTE — Progress Notes (Signed)
Ann, EGD/Ed.

## 2017-10-16 ENCOUNTER — Encounter (INDEPENDENT_AMBULATORY_CARE_PROVIDER_SITE_OTHER): Payer: Self-pay | Admitting: *Deleted

## 2017-10-16 DIAGNOSIS — R1310 Dysphagia, unspecified: Secondary | ICD-10-CM | POA: Insufficient documentation

## 2017-10-16 DIAGNOSIS — R1319 Other dysphagia: Secondary | ICD-10-CM | POA: Insufficient documentation

## 2017-11-21 ENCOUNTER — Ambulatory Visit (HOSPITAL_COMMUNITY)
Admission: RE | Admit: 2017-11-21 | Discharge: 2017-11-21 | Disposition: A | Discharge status: Skilled Nursing Facility | Payer: Medicare Other | Source: Ambulatory Visit | Attending: Internal Medicine | Admitting: Internal Medicine

## 2017-11-21 ENCOUNTER — Other Ambulatory Visit: Payer: Self-pay

## 2017-11-21 ENCOUNTER — Encounter (HOSPITAL_COMMUNITY)
Admission: RE | Disposition: A | Discharge status: Skilled Nursing Facility | Payer: Self-pay | Source: Ambulatory Visit | Attending: Internal Medicine

## 2017-11-21 ENCOUNTER — Encounter (HOSPITAL_COMMUNITY): Payer: Self-pay | Admitting: *Deleted

## 2017-11-21 DIAGNOSIS — Z7982 Long term (current) use of aspirin: Secondary | ICD-10-CM | POA: Insufficient documentation

## 2017-11-21 DIAGNOSIS — F329 Major depressive disorder, single episode, unspecified: Secondary | ICD-10-CM | POA: Diagnosis not present

## 2017-11-21 DIAGNOSIS — K219 Gastro-esophageal reflux disease without esophagitis: Secondary | ICD-10-CM | POA: Diagnosis not present

## 2017-11-21 DIAGNOSIS — E119 Type 2 diabetes mellitus without complications: Secondary | ICD-10-CM | POA: Insufficient documentation

## 2017-11-21 DIAGNOSIS — Z880 Allergy status to penicillin: Secondary | ICD-10-CM | POA: Diagnosis not present

## 2017-11-21 DIAGNOSIS — K5710 Diverticulosis of small intestine without perforation or abscess without bleeding: Secondary | ICD-10-CM | POA: Insufficient documentation

## 2017-11-21 DIAGNOSIS — Z87891 Personal history of nicotine dependence: Secondary | ICD-10-CM | POA: Diagnosis not present

## 2017-11-21 DIAGNOSIS — K2970 Gastritis, unspecified, without bleeding: Secondary | ICD-10-CM | POA: Diagnosis not present

## 2017-11-21 DIAGNOSIS — K2950 Unspecified chronic gastritis without bleeding: Secondary | ICD-10-CM | POA: Diagnosis not present

## 2017-11-21 DIAGNOSIS — R1319 Other dysphagia: Secondary | ICD-10-CM

## 2017-11-21 DIAGNOSIS — R1314 Dysphagia, pharyngoesophageal phase: Secondary | ICD-10-CM | POA: Insufficient documentation

## 2017-11-21 DIAGNOSIS — K228 Other specified diseases of esophagus: Secondary | ICD-10-CM

## 2017-11-21 DIAGNOSIS — Z886 Allergy status to analgesic agent status: Secondary | ICD-10-CM | POA: Insufficient documentation

## 2017-11-21 DIAGNOSIS — R1310 Dysphagia, unspecified: Secondary | ICD-10-CM | POA: Insufficient documentation

## 2017-11-21 DIAGNOSIS — G8929 Other chronic pain: Secondary | ICD-10-CM | POA: Insufficient documentation

## 2017-11-21 DIAGNOSIS — Z794 Long term (current) use of insulin: Secondary | ICD-10-CM | POA: Diagnosis not present

## 2017-11-21 DIAGNOSIS — I2510 Atherosclerotic heart disease of native coronary artery without angina pectoris: Secondary | ICD-10-CM | POA: Insufficient documentation

## 2017-11-21 DIAGNOSIS — Z79899 Other long term (current) drug therapy: Secondary | ICD-10-CM | POA: Diagnosis not present

## 2017-11-21 DIAGNOSIS — R933 Abnormal findings on diagnostic imaging of other parts of digestive tract: Secondary | ICD-10-CM

## 2017-11-21 HISTORY — PX: ESOPHAGOGASTRODUODENOSCOPY: SHX5428

## 2017-11-21 HISTORY — PX: ESOPHAGEAL DILATION: SHX303

## 2017-11-21 LAB — GLUCOSE, CAPILLARY: GLUCOSE-CAPILLARY: 107 mg/dL — AB (ref 65–99)

## 2017-11-21 SURGERY — EGD (ESOPHAGOGASTRODUODENOSCOPY)
Anesthesia: Moderate Sedation

## 2017-11-21 MED ORDER — MIDAZOLAM HCL 5 MG/5ML IJ SOLN
INTRAMUSCULAR | Status: DC | PRN
  Administered 2017-11-21 (×2): 1 mg via INTRAVENOUS
  Administered 2017-11-21: 2 mg via INTRAVENOUS

## 2017-11-21 MED ORDER — MEPERIDINE HCL 50 MG/ML IJ SOLN
INTRAMUSCULAR | Status: DC | PRN
  Administered 2017-11-21: 25 mg via INTRAVENOUS
  Administered 2017-11-21: 15 mg via INTRAVENOUS

## 2017-11-21 MED ORDER — LIDOCAINE VISCOUS 2 % MT SOLN
OROMUCOSAL | Status: AC
  Filled 2017-11-21: qty 15

## 2017-11-21 MED ORDER — MEPERIDINE HCL 50 MG/ML IJ SOLN
INTRAMUSCULAR | Status: AC
  Filled 2017-11-21: qty 1

## 2017-11-21 MED ORDER — MIDAZOLAM HCL 5 MG/5ML IJ SOLN
INTRAMUSCULAR | Status: AC
  Filled 2017-11-21: qty 10

## 2017-11-21 MED ORDER — SODIUM CHLORIDE 0.9 % IV SOLN
INTRAVENOUS | Status: DC
  Administered 2017-11-21: 1000 mL via INTRAVENOUS

## 2017-11-21 MED ORDER — LIDOCAINE VISCOUS 2 % MT SOLN
OROMUCOSAL | Status: DC | PRN
  Administered 2017-11-21: 1 via OROMUCOSAL

## 2017-11-21 MED ORDER — SODIUM CHLORIDE 0.9% FLUSH
INTRAVENOUS | Status: AC
  Filled 2017-11-21: qty 10

## 2017-11-21 MED ORDER — MEPERIDINE HCL 50 MG/ML IJ SOLN
INTRAMUSCULAR | Status: DC
Start: 2017-11-21 — End: 2017-11-21
  Filled 2017-11-21: qty 1

## 2017-11-21 MED ORDER — STERILE WATER FOR IRRIGATION IR SOLN
Status: DC | PRN
  Administered 2017-11-21: 15 mL
  Administered 2017-11-21: 2.5 mL

## 2017-11-21 NOTE — H&P (Signed)
Pincus SanesDoris E Rittenhouse is an 82 y.o. female.   Chief Complaint: Patient is here for EGD and ED. HPI: Patient is 82 year old Caucasian female with multiple medical problems who [facility] [place]  who presents with dysphagia to solids.  She has been having the symptoms off and on for months but lately daily.  She points to lower sternal area sort of bolus obstruction.  She has had few episodes of regurgitation for relief.  She also complains of daily heartburn.  She is on omeprazole.  No history of hematemesis or melena. Patient had a barium pill esophagogram last month which suggested esophageal dysmotility and revealed narrowing distal esophagus delaying passage of barium panel. Esophagus was last tolerated in August 2012 asymptomatic improvement. Last aspirin dose was 3 days ago.  Past Medical History:  Diagnosis Date  . Chronic back pain   . Coronary artery disease   . Depression   . Diabetes mellitus   . GERD (gastroesophageal reflux disease)   . Headache(784.0)   . Irregular heart rate    Hx of    Past Surgical History:  Procedure Laterality Date  . ABDOMINAL HYSTERECTOMY    . ESOPHAGOGASTRODUODENOSCOPY  05/03/2011   Procedure: ESOPHAGOGASTRODUODENOSCOPY (EGD);  Surgeon: Malissa HippoNajeeb U Jolie Strohecker, MD;  Location: [facility];  Service: Endoscopy;  Laterality: N/A;  . MALONEY DILATION  05/03/2011   Procedure: Elease HashimotoMALONEY DILATION;  Surgeon: Malissa HippoNajeeb U Wendell Fiebig, MD;  Location: [facility];  Service: Endoscopy;  Laterality: N/A;  . SAVORY DILATION  05/03/2011   Procedure: SAVORY DILATION;  Surgeon: Malissa HippoNajeeb U Alexes Menchaca, MD;  Location: [facility];  Service: Endoscopy;  Laterality: N/A;    No family history on file. Social History:  reports that she has quit smoking. she has never used smokeless tobacco. She reports that she does not drink alcohol or use drugs.  Allergies:  Allergies  Allergen Reactions  . Arthrotec [Diclofenac-Misoprostol] Nausea Only  . Aspirin Nausea  Only  . Penicillins Other (See Comments)    Per MAR  . Sulfa Antibiotics Rash    Medications Prior to Admission  Medication Sig Dispense Refill  . ALPRAZolam (XANAX) 0.25 MG tablet Take 0.25 mg by mouth daily.     Marland Kitchen. amLODipine (NORVASC) 5 MG tablet Take 5 mg by mouth daily.    . ARIPiprazole (ABILIFY) 5 MG tablet Take 5 mg by mouth daily.     Marland Kitchen. aspirin EC 81 MG tablet Take 81 mg by mouth daily.    Marland Kitchen. atorvastatin (LIPITOR) 10 MG tablet Take 10 mg by mouth every evening.     . benazepril (LOTENSIN) 5 MG tablet Take 5 mg by mouth daily.    . empagliflozin (JARDIANCE) 10 MG TABS tablet Take 10 mg by mouth daily.    Marland Kitchen. FLUoxetine (PROZAC) 40 MG capsule Take 40 mg by mouth daily.    . furosemide (LASIX) 20 MG tablet Take 20 mg by mouth daily.    Marland Kitchen. gabapentin (NEURONTIN) 300 MG capsule Take 300 mg by mouth 3 (three) times daily.    . insulin detemir (LEVEMIR) 100 UNIT/ML injection Inject 45 Units into the skin at bedtime.     . insulin NPH-regular Human (NOVOLIN 70/30) (70-30) 100 UNIT/ML injection Inject 56 Units into the skin daily.     . metFORMIN (GLUCOPHAGE) 1000 MG tablet Take 1,000 mg by mouth 2 (two) times daily with a meal.    . Multiple Vitamin (MULTIVITAMIN) tablet Take 1 tablet by mouth daily.    .Marland Kitchen  omeprazole (PRILOSEC) 20 MG capsule Take 20 mg by mouth daily.    . potassium chloride (KLOR-CON) 20 MEQ packet Take 20 mEq by mouth daily.     Marland Kitchen acetaminophen (TYLENOL) 325 MG tablet Take 650 mg by mouth every 4 (four) hours as needed for moderate pain.    Marland Kitchen guaiFENesin-dextromethorphan (ROBITUSSIN DM) 100-10 MG/5ML syrup Take 10 mLs by mouth every 4 (four) hours as needed for cough.    . ondansetron (ZOFRAN) 4 MG tablet Take 4 mg by mouth every 8 (eight) hours as needed for nausea or vomiting.    . promethazine (PHENERGAN) 50 MG tablet Take 50 mg by mouth every 4 (four) hours as needed for nausea or vomiting.      Results for orders placed or performed during the hospital encounter of  11/21/17 (from the past 48 hour(s))  Glucose, capillary     Status: Abnormal   Collection Time: 11/21/17  1:29 PM  Result Value Ref Range   Glucose-Capillary 107 (H) 65 - 99 mg/dL   No results found.  ROS  Blood pressure (!) 116/51, pulse 63, temperature 97.9 F (36.6 C), temperature source Oral, resp. rate 11, height 5\' 8"  (1.727 m), weight 190 lb (86.2 kg), SpO2 97 %. Physical Exam  Constitutional: She appears well-developed and well-nourished.  HENT:  Mouth/Throat: Oropharynx is clear and moist.  Patient is edentulous in lower jaw and has upper dentures.  Eyes: Conjunctivae are normal. No scleral icterus.  Neck: No thyromegaly present.  Cardiovascular: Normal rate, regular rhythm and normal heart sounds.  No murmur heard. Respiratory: Effort normal and breath sounds normal.  GI:  Abdomen is full.  It is soft with mild midepigastric tenderness.  No organomegaly or masses.  Musculoskeletal: She exhibits no edema.  Lymphadenopathy:    She has no cervical adenopathy.  Neurological: She is alert.  Skin: Skin is warm and dry.     Assessment/Plan Solid food dysphagia. Abnormal barium pill esophagogram. EGD with ED.  Lionel December, MD 11/21/2017, 2:38 PM

## 2017-11-21 NOTE — Discharge Instructions (Signed)
Resume aspirin on 11/24/2017. Resume other medications as before. Resume usual diet. Physician will call with biopsy results.   Upper Endoscopy, Care After Refer to this sheet in the next few weeks. These instructions provide you with information about caring for yourself after your procedure. Your health care provider may also give you more specific instructions. Your treatment has been planned according to current medical practices, but problems sometimes occur. Call your health care provider if you have any problems or questions after your procedure. What can I expect after the procedure? After the procedure, it is common to have:  A sore throat.  Bloating.  Nausea.  Follow these instructions at home:  Follow instructions from your health care provider about what to eat or drink after your procedure.  Return to your normal activities as told by your health care provider. Ask your health care provider what activities are safe for you.  Take over-the-counter and prescription medicines only as told by your health care provider.  Do not drive for 24 hours if you received a sedative.  Keep all follow-up visits as told by your health care provider. This is important. Contact a health care provider if:  You have a sore throat that lasts longer than one day.  You have trouble swallowing. Get help right away if:  You have a fever.  You vomit blood or your vomit looks like coffee grounds.  You have bloody, black, or tarry stools.  You have a severe sore throat or you cannot swallow.  You have difficulty breathing.  You have severe pain in your chest or belly. This information is not intended to replace advice given to you by your health care provider. Make sure you discuss any questions you have with your health care provider. Document Released: 02/26/2012 Document Revised: 02/02/2016 Document Reviewed: 06/09/2015 Elsevier Interactive Patient Education  [employer]2018 [employer]

## 2017-11-21 NOTE — Op Note (Signed)
[facility] Patient Name: Monica PalmerDoris Garner Procedure Date: 11/21/2017 1:00 PM MRN: [id number] Date of Birth: 09/16/1935 Attending MD: Lionel DecemberNajeeb Dustin Bumbaugh , MD CSN: [id number] Age: 82 Admit Type: Outpatient Procedure:                Upper GI endoscopy Indications:              Esophageal dysphagia, Abnormal cine-esophagram Providers:                Lionel DecemberNajeeb Hasset Chaviano, MD, Buel ReamAngela A. Thomasena Edisollins RN, RN, Edythe ClarityKelly                            Cox, Technician, Nena PolioLisa Moore, RN Referring MD:             Lia HoppingXaje Hasanaj, MD Medicines:                Lidocaine spray, Meperidine 40 mg IV, Midazolam 4                            mg IV Complications:            No immediate complications. Estimated Blood Loss:     Estimated blood loss was minimal. Procedure:                Pre-Anesthesia Assessment:                           - Prior to the procedure, a History and Physical                            was performed, and patient medications and                            allergies were reviewed. The patient's tolerance of                            previous anesthesia was also reviewed. The risks                            and benefits of the procedure and the sedation                            options and risks were discussed with the patient.                            All questions were answered, and informed consent                            was obtained. Prior Anticoagulants: The patient                            last took aspirin 3 days prior to the procedure.                            ASA Grade Assessment: III - A patient with severe  systemic disease. After reviewing the risks and                            benefits, the patient was deemed in satisfactory                            condition to undergo the procedure.                           After obtaining informed consent, the endoscope was                            passed under direct vision. Throughout the          procedure, the patient's blood pressure, pulse, and                            oxygen saturations were monitored continuously. The                            EG-[id number]) scope was introduced through the                            mouth, and advanced to the second part of duodenum.                            The EG-[id number]) scope was introduced through                            the mouth, and advanced to the. The EG-[id number]                            ([id number]) scope was introduced through the and                            advanced to the. The upper GI endoscopy was                            accomplished without difficulty. The patient                            tolerated the procedure well. Scope In: 2:49:40 PM Scope Out: 3:09:29 PM Total Procedure Duration: 0 hours 19 minutes 49 seconds  Findings:      The lumen of the middle third of the esophagus was mildly dilated.      The exam of the esophagus was otherwise normal.      The Z-line was irregular and was found 40 cm from the incisors.      No endoscopic abnormality was evident in the esophagus to explain the       patient's complaint of dysphagia. It was decided, however, to proceed       with dilation of the entire esophagus. 56 Fr. Maloney dilator passed       without any resistence. The dilation site was examined following       endoscope reinsertion and showed no change and  no bleeding, mucosal tear       or perforation.      Localized moderate inflammation characterized by adherent blood,       congestion (edema), erosions, erythema and friability was found in the       gastric body. Biopsies were taken with a cold forceps for histology.       Focal depression at gastric body from previous PEG.      The exam of the stomach was otherwise normal.      The duodenal bulb was normal.      A large non-bleeding diverticulum was found in the second portion of the       duodenum containing food debris. Impression:                - Dilation in the middle third of the esophagus.                           - Z-line irregular, 40 cm from the incisors.                           - No endoscopic esophageal abnormality to explain                            patient's dysphagia. Esophagus dilated.                           - Gastritis. Biopsied.                           - Normal duodenal bulb.                           - Non-bleeding duodenal diverticulum.                           Comment: No stricture or rind noted to esophagus.                           Therefore dysphagia secondary to motility disorder. Moderate Sedation:      Moderate (conscious) sedation was administered by the endoscopy nurse       and supervised by the endoscopist. The following parameters were       monitored: oxygen saturation, heart rate, blood pressure, CO2       capnography and response to care. Total physician intraservice time was       25 minutes. Recommendation:           - Patient has a contact number available for                            emergencies. The signs and symptoms of potential                            delayed complications were discussed with the                            patient. Return to normal activities tomorrow.  Written discharge instructions were provided to the                            patient.                           - Resume previous diet today.                           - Continue present medications.                           - No aspirin, ibuprofen, naproxen, or other                            non-steroidal anti-inflammatory drugs for 3 days.                           - Await pathology results. Procedure Code(s):        --- Professional ---                           [phone], Esophagogastroduodenoscopy, flexible,                            transoral; with biopsy, single or multiple                           99152, Moderate sedation services provided by the                             same physician or other qualified health care                            professional performing the diagnostic or                            therapeutic service that the sedation supports,                            requiring the presence of an independent trained                            observer to assist in the monitoring of the                            patient's level of consciousness and physiological                            status; initial 15 minutes of intraservice time,                            patient age 24 years or older                           [phone], Moderate sedation services; each additional  15 minutes intraservice time Diagnosis Code(s):        --- Professional ---                           K22.8, Other specified diseases of esophagus                           K29.70, Gastritis, unspecified, without bleeding                           R13.14, Dysphagia, pharyngoesophageal phase                           K57.10, Diverticulosis of small intestine without                            perforation or abscess without bleeding                           R93.3, Abnormal findings on diagnostic imaging of                            other parts of digestive tract CPT copyright 2016 [employer]. All rights reserved. The codes documented in this report are preliminary and upon coder review may  be revised to meet current compliance requirements. Lionel December, MD Lionel December, MD 11/21/2017 3:30:40 PM This report has been signed electronically. Number of Addenda: 0

## 2017-11-27 ENCOUNTER — Encounter (HOSPITAL_COMMUNITY): Payer: Self-pay | Admitting: Internal Medicine

## 2018-05-11 DEATH — deceased
